# Patient Record
Sex: Female | Born: 1995 | Race: Black or African American | Hispanic: No | Marital: Single | Smoking: Former smoker
Health system: Southern US, Community
[De-identification: ages and names within clinical notes are randomized; demographics above are authoritative.]

## PROBLEM LIST (undated history)

## (undated) DIAGNOSIS — R51 Headache: Secondary | ICD-10-CM

## (undated) DIAGNOSIS — Z789 Other specified health status: Secondary | ICD-10-CM

## (undated) DIAGNOSIS — Z8619 Personal history of other infectious and parasitic diseases: Secondary | ICD-10-CM

## (undated) DIAGNOSIS — J302 Other seasonal allergic rhinitis: Secondary | ICD-10-CM

## (undated) DIAGNOSIS — R87810 Cervical high risk human papillomavirus (HPV) DNA test positive: Secondary | ICD-10-CM

## (undated) DIAGNOSIS — Z3490 Encounter for supervision of normal pregnancy, unspecified, unspecified trimester: Secondary | ICD-10-CM

## (undated) DIAGNOSIS — O139 Gestational [pregnancy-induced] hypertension without significant proteinuria, unspecified trimester: Secondary | ICD-10-CM

## (undated) DIAGNOSIS — R8789 Other abnormal findings in specimens from female genital organs: Secondary | ICD-10-CM

## (undated) DIAGNOSIS — R87610 Atypical squamous cells of undetermined significance on cytologic smear of cervix (ASC-US): Secondary | ICD-10-CM

## (undated) DIAGNOSIS — Z30017 Encounter for initial prescription of implantable subdermal contraceptive: Secondary | ICD-10-CM

## (undated) DIAGNOSIS — Z3009 Encounter for other general counseling and advice on contraception: Secondary | ICD-10-CM

## (undated) HISTORY — DX: Encounter for other general counseling and advice on contraception: Z30.09

## (undated) HISTORY — DX: Encounter for initial prescription of implantable subdermal contraceptive: Z30.017

## (undated) HISTORY — DX: Atypical squamous cells of undetermined significance on cytologic smear of cervix (ASC-US): R87.610

## (undated) HISTORY — DX: Headache: R51

## (undated) HISTORY — DX: Other seasonal allergic rhinitis: J30.2

## (undated) HISTORY — PX: NO PAST SURGERIES: SHX2092

## (undated) HISTORY — DX: Other abnormal findings in specimens from female genital organs: R87.89

## (undated) HISTORY — DX: Cervical high risk human papillomavirus (HPV) DNA test positive: R87.810

## (undated) HISTORY — DX: Personal history of other infectious and parasitic diseases: Z86.19

---
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
## 2015-08-20 ENCOUNTER — Telehealth: Payer: Self-pay | Admitting: Adult Health

## 2015-08-20 NOTE — Telephone Encounter (Signed)
Pt states that she is having heavy bleeding and lower left abdominal pain. Pt states that she has the nexplanon and has never had bleeding before this. Pt states that she has never had the pain on her lower left side either.   Pt was offered an appointment and she stated that she was going to the doctor tomorrow and that she would discuss the problems with her doctor tomorrow.

## 2015-09-04 ENCOUNTER — Ambulatory Visit (INDEPENDENT_AMBULATORY_CARE_PROVIDER_SITE_OTHER): Payer: Medicaid Other | Admitting: Obstetrics & Gynecology

## 2015-09-04 ENCOUNTER — Encounter: Payer: Self-pay | Admitting: Obstetrics & Gynecology

## 2015-09-04 VITALS — BP 100/60 | HR 84 | Ht 64.0 in | Wt 136.0 lb

## 2015-09-04 DIAGNOSIS — N938 Other specified abnormal uterine and vaginal bleeding: Secondary | ICD-10-CM

## 2015-09-04 DIAGNOSIS — N6320 Unspecified lump in the left breast, unspecified quadrant: Secondary | ICD-10-CM

## 2015-09-04 DIAGNOSIS — N63 Unspecified lump in breast: Secondary | ICD-10-CM

## 2015-09-04 MED ORDER — MEGESTROL ACETATE 40 MG PO TABS: ORAL_TABLET | ORAL | Status: DC

## 2015-09-04 NOTE — Progress Notes (Signed)
Patient ID: Stephanie Hodges, female   DOB: 01/12/1996, 19 y.o.   MRN: [id number] Chief Complaint  Patient presents with  . noticed lump in left breast    discuss going from Nexplanon to pill    Blood pressure 100/60, pulse 84, height 5\' 4"  (1.626 m), weight 136 lb (61.689 kg), last menstrual period 08/22/2015.  19 y.o. G1P0101 Patient's last menstrual period was 08/22/2015. The current method of family planning is nexplanon.  Subjective Painless left breast mass x 1 week Not associated with bleeding  Objective Firm irregular non tender 2x2 cm mass left breast Axilla negative  Pertinent ROS No sob or chest wall pain  Labs or studies     Impression Diagnoses this Encounter::   ICD-9-CM ICD-10-CM   1. Left breast mass 611.72 N63 MM Digital Diagnostic Unilat L     US BREAST COMPLETE UNI LEFT INC AXILLA     US BREAST COMPLETE UNI LEFT INC AXILLA     CANCELED: MM Digital Diagnostic Unilat L  2. DUB (dysfunctional uterine bleeding) 626.8 N93.8 megestrol (MEGACE) 40 MG tablet    Established relevant diagnosis(es):   Plan/Recommendations: Meds ordered this encounter  Medications  . etonogestrel (NEXPLANON) 68 MG IMPL implant    Sig: 1 each by Subdermal route once.  . megestrol (MEGACE) 40 MG tablet    Sig: 3x5d, 2x5d, then 1 daily to help control vaginal bleeding. Stop taking when bleeding stops.    Dispense:  45 tablet    Refill:  3    Labs or Scans Ordered: Orders Placed This Encounter  Procedures  . US BREAST COMPLETE UNI LEFT INC AXILLA      Follow up Return if symptoms worsen or fail to improve.     All questions were answered.

## 2015-09-08 ENCOUNTER — Ambulatory Visit: Payer: Medicaid Other | Admitting: Adult Health

## 2015-09-09 ENCOUNTER — Ambulatory Visit (HOSPITAL_COMMUNITY)
Admission: RE | Admit: 2015-09-09 | Discharge: 2015-09-09 | Disposition: A | Discharge status: Home / Self Care | Payer: Medicaid Other | Source: Ambulatory Visit | Attending: Obstetrics & Gynecology | Admitting: Obstetrics & Gynecology

## 2015-09-09 DIAGNOSIS — N6320 Unspecified lump in the left breast, unspecified quadrant: Secondary | ICD-10-CM

## 2015-09-09 DIAGNOSIS — N63 Unspecified lump in breast: Secondary | ICD-10-CM | POA: Insufficient documentation

## 2015-10-01 ENCOUNTER — Ambulatory Visit: Payer: Medicaid Other | Admitting: Obstetrics and Gynecology

## 2015-11-17 ENCOUNTER — Encounter: Payer: Medicaid Other | Admitting: Women's Health

## 2016-01-27 ENCOUNTER — Telehealth: Payer: Self-pay | Admitting: *Deleted

## 2016-01-27 NOTE — Telephone Encounter (Signed)
Pt informed Megace refills are at [place] from the original RX on 08/2015.

## 2016-02-02 ENCOUNTER — Telehealth: Payer: Self-pay | Admitting: Obstetrics & Gynecology

## 2016-02-02 DIAGNOSIS — D242 Benign neoplasm of left breast: Secondary | ICD-10-CM

## 2016-02-03 NOTE — Telephone Encounter (Signed)
Order placed for left breast US

## 2016-02-09 ENCOUNTER — Telehealth: Payer: Self-pay | Admitting: *Deleted

## 2016-02-09 NOTE — Telephone Encounter (Signed)
Pt informed ultrasound [facility] appt scheduled for 02/17/2016 @ 0845, no powder, lotion, or deodorant. Pt verbalized understanding.

## 2016-02-17 ENCOUNTER — Ambulatory Visit (HOSPITAL_COMMUNITY)
Admission: RE | Admit: 2016-02-17 | Discharge: 2016-02-17 | Disposition: A | Discharge status: Home / Self Care | Payer: Medicaid Other | Source: Ambulatory Visit | Attending: Adult Health | Admitting: Adult Health

## 2016-02-17 DIAGNOSIS — D242 Benign neoplasm of left breast: Secondary | ICD-10-CM | POA: Diagnosis present

## 2016-06-29 ENCOUNTER — Ambulatory Visit: Payer: Medicaid Other | Admitting: Obstetrics & Gynecology

## 2016-06-29 ENCOUNTER — Encounter: Payer: Self-pay | Admitting: Obstetrics & Gynecology

## 2016-08-09 ENCOUNTER — Telehealth: Payer: Self-pay | Admitting: Obstetrics & Gynecology

## 2016-08-09 NOTE — Telephone Encounter (Signed)
Pt called stating that she needs a refill of her medication megestrol (MEGACE) 40 MG tablet , Please contact pt

## 2016-08-09 NOTE — Telephone Encounter (Signed)
Requesting refill on Megace

## 2016-08-10 ENCOUNTER — Other Ambulatory Visit: Payer: Self-pay | Admitting: Obstetrics & Gynecology

## 2016-08-10 ENCOUNTER — Ambulatory Visit: Payer: Medicaid Other | Admitting: Obstetrics & Gynecology

## 2016-08-10 DIAGNOSIS — N938 Other specified abnormal uterine and vaginal bleeding: Secondary | ICD-10-CM

## 2016-08-10 MED ORDER — MEGESTROL ACETATE 40 MG PO TABS: ORAL_TABLET | ORAL | 11 refills | Status: DC

## 2016-08-13 ENCOUNTER — Ambulatory Visit: Payer: Medicaid Other | Admitting: Obstetrics & Gynecology

## 2016-08-25 ENCOUNTER — Other Ambulatory Visit: Payer: Self-pay | Admitting: Advanced Practice Midwife

## 2016-08-25 DIAGNOSIS — N938 Other specified abnormal uterine and vaginal bleeding: Secondary | ICD-10-CM

## 2016-08-25 MED ORDER — MEGESTROL ACETATE 40 MG PO TABS: ORAL_TABLET | ORAL | 11 refills | Status: DC

## 2016-08-25 NOTE — Progress Notes (Signed)
Refill megatce

## 2016-09-13 ENCOUNTER — Telehealth: Payer: Self-pay | Admitting: *Deleted

## 2016-09-13 DIAGNOSIS — N6320 Unspecified lump in the left breast, unspecified quadrant: Secondary | ICD-10-CM

## 2016-09-13 NOTE — Telephone Encounter (Signed)
Left message x 1. JSY 

## 2016-09-13 NOTE — Telephone Encounter (Signed)
Pt aware has left breast US 10/24 at 4 pm at [facility]

## 2016-09-13 NOTE — Telephone Encounter (Signed)
Spoke with pt. Pt needs breast US scheduled at [facility]. Mondays are best for pt, anytime. Thanks!! [place]

## 2016-09-21 ENCOUNTER — Other Ambulatory Visit: Payer: Self-pay | Admitting: Adult Health

## 2016-09-21 ENCOUNTER — Ambulatory Visit (HOSPITAL_COMMUNITY)
Admission: RE | Admit: 2016-09-21 | Discharge: 2016-09-21 | Disposition: A | Discharge status: Home / Self Care | Payer: Medicaid Other | Source: Ambulatory Visit | Attending: Adult Health | Admitting: Adult Health

## 2016-09-21 DIAGNOSIS — N6320 Unspecified lump in the left breast, unspecified quadrant: Secondary | ICD-10-CM | POA: Diagnosis present

## 2016-09-21 DIAGNOSIS — N6323 Unspecified lump in the left breast, lower outer quadrant: Secondary | ICD-10-CM | POA: Insufficient documentation

## 2016-09-28 ENCOUNTER — Ambulatory Visit (HOSPITAL_COMMUNITY)
Admission: RE | Admit: 2016-09-28 | Discharge: 2016-09-28 | Disposition: A | Discharge status: Home / Self Care | Payer: Medicaid Other | Source: Ambulatory Visit | Attending: Adult Health | Admitting: Adult Health

## 2016-09-28 ENCOUNTER — Other Ambulatory Visit: Payer: Self-pay | Admitting: Adult Health

## 2016-09-28 ENCOUNTER — Encounter (HOSPITAL_COMMUNITY): Payer: Self-pay

## 2016-09-28 DIAGNOSIS — D242 Benign neoplasm of left breast: Secondary | ICD-10-CM | POA: Diagnosis not present

## 2016-09-28 DIAGNOSIS — N6320 Unspecified lump in the left breast, unspecified quadrant: Secondary | ICD-10-CM | POA: Diagnosis present

## 2016-09-28 MED ORDER — LIDOCAINE HCL (PF) 1 % IJ SOLN
INTRAMUSCULAR | Status: AC
  Filled 2016-09-28: qty 10

## 2017-10-04 ENCOUNTER — Other Ambulatory Visit: Payer: Medicaid Other | Admitting: Adult Health

## 2017-10-19 ENCOUNTER — Encounter (HOSPITAL_COMMUNITY): Payer: Self-pay | Admitting: Emergency Medicine

## 2017-10-19 ENCOUNTER — Emergency Department (HOSPITAL_COMMUNITY)
Admission: EM | Admit: 2017-10-19 | Discharge: 2017-10-19 | Disposition: A | Discharge status: Home / Self Care | Payer: Medicaid Other | Attending: Emergency Medicine | Admitting: Emergency Medicine

## 2017-10-19 ENCOUNTER — Emergency Department (HOSPITAL_COMMUNITY): Payer: Medicaid Other

## 2017-10-19 DIAGNOSIS — S60941A Unspecified superficial injury of left index finger, initial encounter: Secondary | ICD-10-CM | POA: Diagnosis present

## 2017-10-19 DIAGNOSIS — Z79899 Other long term (current) drug therapy: Secondary | ICD-10-CM | POA: Diagnosis not present

## 2017-10-19 DIAGNOSIS — Y999 Unspecified external cause status: Secondary | ICD-10-CM | POA: Diagnosis not present

## 2017-10-19 DIAGNOSIS — Y929 Unspecified place or not applicable: Secondary | ICD-10-CM | POA: Insufficient documentation

## 2017-10-19 DIAGNOSIS — Y939 Activity, unspecified: Secondary | ICD-10-CM | POA: Diagnosis not present

## 2017-10-19 DIAGNOSIS — F17210 Nicotine dependence, cigarettes, uncomplicated: Secondary | ICD-10-CM | POA: Insufficient documentation

## 2017-10-19 DIAGNOSIS — Z0471 Encounter for examination and observation following alleged adult physical abuse: Secondary | ICD-10-CM | POA: Insufficient documentation

## 2017-10-19 DIAGNOSIS — S63611A Unspecified sprain of left index finger, initial encounter: Secondary | ICD-10-CM

## 2017-10-19 DIAGNOSIS — S21152A Open bite of left front wall of thorax without penetration into thoracic cavity, initial encounter: Secondary | ICD-10-CM | POA: Diagnosis not present

## 2017-10-19 DIAGNOSIS — T148XXA Other injury of unspecified body region, initial encounter: Secondary | ICD-10-CM

## 2017-10-19 MED ORDER — IBUPROFEN 600 MG PO TABS: 600.0000 mg | ORAL_TABLET | Freq: Three times a day (TID) | ORAL | 0 refills | Status: DC | PRN

## 2017-10-19 MED ORDER — AMOXICILLIN-POT CLAVULANATE 875-125 MG PO TABS: 1.0000 | ORAL_TABLET | Freq: Two times a day (BID) | ORAL | 0 refills | Status: DC

## 2017-10-19 MED ORDER — IBUPROFEN 800 MG PO TABS
800.0000 mg | ORAL_TABLET | Freq: Once | ORAL | Status: AC
  Administered 2017-10-19: 800 mg via ORAL
  Filled 2017-10-19: qty 1
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
## 2017-10-19 NOTE — Discharge Instructions (Signed)
Your x-rays of your left index finger are negative for bony injury or dislocation.  Wear the finger splint for comfort until your symptoms are improved.  Complete the entire course of the antibiotic prescribed.  Clean your bite wound with mild soapy water twice daily and watch closely for any signs of infection.
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
## 2017-10-24 ENCOUNTER — Other Ambulatory Visit (HOSPITAL_COMMUNITY)
Admission: RE | Admit: 2017-10-24 | Discharge: 2017-10-24 | Disposition: A | Discharge status: Home / Self Care | Payer: Medicaid Other | Source: Ambulatory Visit | Attending: Adult Health | Admitting: Adult Health

## 2017-10-24 ENCOUNTER — Encounter: Payer: Self-pay | Admitting: Adult Health

## 2017-10-24 ENCOUNTER — Ambulatory Visit: Payer: Medicaid Other | Admitting: Adult Health

## 2017-10-24 VITALS — BP 120/80 | HR 60 | Ht 65.0 in | Wt 161.0 lb

## 2017-10-24 DIAGNOSIS — N939 Abnormal uterine and vaginal bleeding, unspecified: Secondary | ICD-10-CM | POA: Insufficient documentation

## 2017-10-24 DIAGNOSIS — Z01411 Encounter for gynecological examination (general) (routine) with abnormal findings: Secondary | ICD-10-CM | POA: Diagnosis not present

## 2017-10-24 DIAGNOSIS — Z975 Presence of (intrauterine) contraceptive device: Secondary | ICD-10-CM | POA: Insufficient documentation

## 2017-10-24 DIAGNOSIS — Z01419 Encounter for gynecological examination (general) (routine) without abnormal findings: Secondary | ICD-10-CM

## 2017-10-24 DIAGNOSIS — Z3202 Encounter for pregnancy test, result negative: Secondary | ICD-10-CM | POA: Diagnosis not present

## 2017-10-24 DIAGNOSIS — F32A Depression, unspecified: Secondary | ICD-10-CM

## 2017-10-24 DIAGNOSIS — D242 Benign neoplasm of left breast: Secondary | ICD-10-CM | POA: Diagnosis not present

## 2017-10-24 DIAGNOSIS — F329 Major depressive disorder, single episode, unspecified: Secondary | ICD-10-CM

## 2017-10-24 DIAGNOSIS — Z0001 Encounter for general adult medical examination with abnormal findings: Secondary | ICD-10-CM

## 2017-10-24 DIAGNOSIS — R87610 Atypical squamous cells of undetermined significance on cytologic smear of cervix (ASC-US): Secondary | ICD-10-CM | POA: Diagnosis not present

## 2017-10-24 DIAGNOSIS — Z30011 Encounter for initial prescription of contraceptive pills: Secondary | ICD-10-CM | POA: Diagnosis not present

## 2017-10-24 LAB — POCT URINE PREGNANCY: PREG TEST UR: NEGATIVE

## 2017-10-24 MED ORDER — ESCITALOPRAM OXALATE 10 MG PO TABS: 10.0000 mg | ORAL_TABLET | Freq: Every day | ORAL | 6 refills | Status: DC

## 2017-10-24 MED ORDER — NORETHIN ACE-ETH ESTRAD-FE 1-20 MG-MCG(24) PO CAPS: 1.0000 | ORAL_CAPSULE | Freq: Every day | ORAL | 3 refills | Status: DC
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
## 2017-10-28 LAB — CYTOLOGY - PAP
CHLAMYDIA, DNA PROBE: NEGATIVE
Diagnosis: UNDETERMINED — AB
HPV: DETECTED — AB
NEISSERIA GONORRHEA: NEGATIVE

## 2017-10-31 ENCOUNTER — Ambulatory Visit: Payer: Medicaid Other | Admitting: Adult Health

## 2017-10-31 ENCOUNTER — Encounter: Payer: Self-pay | Admitting: Adult Health

## 2017-10-31 VITALS — BP 120/90 | HR 97 | Ht 65.0 in | Wt 154.0 lb

## 2017-10-31 DIAGNOSIS — Z3049 Encounter for surveillance of other contraceptives: Secondary | ICD-10-CM

## 2017-10-31 DIAGNOSIS — R87610 Atypical squamous cells of undetermined significance on cytologic smear of cervix (ASC-US): Secondary | ICD-10-CM

## 2017-10-31 DIAGNOSIS — R87810 Cervical high risk human papillomavirus (HPV) DNA test positive: Secondary | ICD-10-CM

## 2017-10-31 DIAGNOSIS — Z3046 Encounter for surveillance of implantable subdermal contraceptive: Secondary | ICD-10-CM

## 2017-10-31 HISTORY — DX: Atypical squamous cells of undetermined significance on cytologic smear of cervix (ASC-US): R87.610

## 2017-10-31 NOTE — Progress Notes (Signed)
Subjective:     Patient ID: Stephanie Hodges, female   DOB: August 24, 1996, 21 y.o.   MRN: [id number]  HPI Stephanie Hodges is a 21 year old black female in for nexplanon removal, is on [place] already.   Review of Systems For nexplanon removal Reviewed past medical,surgical, social and family history. Reviewed medications and allergies.     Objective:   Physical Exam BP 120/90 (BP Location: Right Arm, Patient Position: Sitting, Cuff Size: Small)   Pulse 97   Ht 5\' 5"  (1.651 m)   Wt 154 lb (69.9 kg)   LMP 10/24/2017   BMI 25.63 kg/m Consent sigend. Left arm cleansed with betadine, and injected with 1.5 cc 2% lidocaine and waited til numb.Under sterile technique a #11 blade was used to make small vertical incision, and a curved forceps was used to easily remove rod. Steri strips applied. Pressure dressing applied.   Discussed that pap was ASCUS with +HPV and negative for GC/chlamydia, will get colpo next week.  Assessment:     1. Encounter for Nexplanon removal   2. ASCUS with positive high risk HPV cervical       Plan:     Use condoms, keep clean and dry x 24 hours, no heavy lifting, keep steri strips on x 72 hours, Keep pressure dressing on x 24 hours. Not given for work, no heavy lifting for 24 hours.   Schedule colpo in 1 week with Dr Elonda Husky F/u with me in 6 weeks on lexapro Review handouts on abnormal pap and HPV

## 2017-10-31 NOTE — Patient Instructions (Signed)
Human Papillomavirus Human papillomavirus (HPV) is the most common sexually transmitted infection (STI). It is easy to pass it from person to person (contagious). HPV can cause cervical cancer, anal cancer, and genital warts. The genital warts can be seen and felt. Also, there may be wartlike regions in the throat. HPV may not have any symptoms. It is possible to have HPV for a long time and not know it. You may pass HPV on to others without knowing it. Follow these instructions at home:  Take medicines as told by your doctor.  Use over-the-counter creams for itching as told by your doctor.  Keep all follow-up visits. Make sure to get Pap tests as told by your doctor.  Do not touch or scratch the warts.  Do not treat genital warts with medicines used for treating hand warts.  Do not have sex while you are getting treatment.  Do not douche or use tampons during treatment of HPV.  Tell your sex partner about your infection because he or she may also need treatment.  If you get pregnant, tell your doctor that you had HPV. Your doctor will watch your pregnancy closely. This is important to keep your baby safe.  After treatment, use condoms during sex to prevent future infections.  Have only one sex partner.  Have a sex partner who does not have other sex partners. Contact a doctor if:  The treated skin is red, swollen, or painful.  You have a fever.  You feel ill.  You feel lumps or pimple-like areas in and around your genital area.  You have bleeding of the vagina or the area that was treated.  You have pain during sex. This information is not intended to replace advice given to you by your health care provider. Make sure you discuss any questions you have with your health care provider. Document Released: 10/28/2008 Document Revised: 04/22/2016 Document Reviewed: 02/20/2014 Elsevier Interactive Patient Education  2017 [employer]. Use condoms,, keep clean and dry x 24  hours, no heavy lifting, keep steri strips on x 72 hours, Keep pressure dressing on x 24 hours. Follow up in 1 week for colpo

## 2017-11-08 ENCOUNTER — Encounter: Payer: Medicaid Other | Admitting: Obstetrics & Gynecology

## 2017-11-09 ENCOUNTER — Telehealth: Payer: Self-pay | Admitting: *Deleted

## 2017-11-09 NOTE — Telephone Encounter (Signed)
Pt is requesting refill on megesterol. Informed pt that I would send her request to a provider and she should check with her pharmacy in the next 24 hrs.

## 2017-11-10 NOTE — Telephone Encounter (Signed)
Pt had nexplanon removed 12/3, was on COC for 2 week, never took them right, stopped completely 2 days ago. Thinks shes pregnant.  If test is neg, she will call back and we can figure out a [facility]

## 2017-11-28 ENCOUNTER — Other Ambulatory Visit: Payer: Self-pay

## 2017-11-28 ENCOUNTER — Encounter (HOSPITAL_COMMUNITY): Payer: Self-pay | Admitting: Emergency Medicine

## 2017-11-28 ENCOUNTER — Emergency Department (HOSPITAL_COMMUNITY)
Admission: EM | Admit: 2017-11-28 | Discharge: 2017-11-28 | Disposition: A | Discharge status: Home / Self Care | Payer: Medicaid Other | Attending: Emergency Medicine | Admitting: Emergency Medicine

## 2017-11-28 ENCOUNTER — Encounter: Payer: Medicaid Other | Admitting: Obstetrics & Gynecology

## 2017-11-28 DIAGNOSIS — L509 Urticaria, unspecified: Secondary | ICD-10-CM | POA: Diagnosis not present

## 2017-11-28 DIAGNOSIS — F17210 Nicotine dependence, cigarettes, uncomplicated: Secondary | ICD-10-CM | POA: Insufficient documentation

## 2017-11-28 DIAGNOSIS — R21 Rash and other nonspecific skin eruption: Secondary | ICD-10-CM | POA: Diagnosis present

## 2017-11-28 DIAGNOSIS — Z79899 Other long term (current) drug therapy: Secondary | ICD-10-CM | POA: Diagnosis not present

## 2017-11-28 DIAGNOSIS — F329 Major depressive disorder, single episode, unspecified: Secondary | ICD-10-CM | POA: Diagnosis not present

## 2017-11-28 LAB — POC URINE PREG, ED: PREG TEST UR: NEGATIVE

## 2017-11-28 MED ORDER — DIPHENHYDRAMINE HCL 25 MG PO CAPS
25.0000 mg | ORAL_CAPSULE | Freq: Once | ORAL | Status: AC
  Administered 2017-11-28: 25 mg via ORAL
  Filled 2017-11-28: qty 1

## 2017-11-28 MED ORDER — PREDNISONE 10 MG PO TABS: ORAL_TABLET | ORAL | 0 refills | Status: DC

## 2017-11-28 NOTE — Discharge Instructions (Signed)
Continue taking over-the-counter Benadryl 1 capsule every 4-6 hours as needed for itching.  Cool compresses on and off.  Follow-up with your primary doctor or return here for any worsening symptoms.

## 2017-11-28 NOTE — ED Triage Notes (Signed)
Rash to bilateral arms and neck since this morning.  Took benadryl this morning with no relief.

## 2017-11-28 NOTE — ED Provider Notes (Signed)
[facility] EMERGENCY DEPARTMENT Provider Note   CSN: [id number] Arrival date & time: 11/28/17  1502     History   Chief Complaint Chief Complaint  Patient presents with  . Rash    HPI Stephanie Hodges is a 21 y.o. female.  HPI  Stephanie Hodges is a 21 y.o. female who presents to the Emergency Department complaining of burning itching rash to both arms and neck.  States that she woke this morning and noticed several red, raised areas to both arms and the back of her neck.  She took Benadryl earlier today without relief.  She denies known insect bites, new medications, or exposure to new products.  No chest tightness, wheezing, shortness of breath, difficulty swallowing, or swelling of the lips or tongue.  No prior history of anaphylaxis   Past Medical History:  Diagnosis Date  . 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 Contraceptive education 10/03/2014  . Headache(784.0)    migraines  . History of chlamydia   . Nexplanon insertion 10/16/2014   Inserted left arm 10/16/14 remove 10/16/17  . No pertinent past medical history   . Pregnant   . Seasonal allergies     Patient Active Problem List   Diagnosis Date Noted  . 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 Encounter for Nexplanon removal 10/31/2017  . Nexplanon in place 10/24/2017  . 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 Abnormal uterine bleeding (AUB) 10/24/2017  . Encounter for initial prescription of contraceptive pills 10/24/2017  . Depression 10/24/2017  . Pregnancy examination or test, negative result 10/24/2017  . Well woman exam with routine gynecological exam 10/16/2014  . Contraceptive education 10/03/2014    Past Surgical History:  Procedure Laterality Date  . NO PAST SURGERIES      OB History    Gravida Para Term Preterm AB Living   1 1   1   1    SAB TAB Ectopic Multiple Live Births           1       Home Medications    Prior to Admission medications   Medication  Sig Start Date End Date Taking? Authorizing Provider  Acetaminophen (TYLENOL PO) Take by mouth as needed.    [provider]  aspirin 81 MG tablet Take 81 mg by mouth as needed for pain.    [provider]  escitalopram (LEXAPRO) 10 MG tablet Take 1 tablet (10 mg total) by mouth daily. 10/24/17   Estill Dooms, NP  ibuprofen (ADVIL,MOTRIN) 600 MG tablet Take 1 tablet (600 mg total) by mouth every 8 (eight) hours as needed for moderate pain. 10/19/17   Evalee Jefferson, PA-C  Norethin Ace-Eth Estrad-FE (TAYTULLA) 1-20 MG-MCG(24) CAPS Take 1 tablet by mouth daily. 10/24/17   Estill Dooms, NP    Family History Family History  Problem Relation Age of Onset  . Hypertension Mother   . Stroke Mother     Social History Social History   Tobacco Use  . Smoking status: Current Every Day Smoker    Packs/day: 0.22    Types: Cigarettes  . Smokeless tobacco: Never Used  Substance Use Topics  . Alcohol use: No  . Drug use: No     Allergies   Coconut flavor [flavoring agent] and Mushroom extract complex   Review of Systems Review of Systems  Constitutional: Negative for activity change, appetite change, chills and fever.  HENT: Negative for facial swelling, sore throat and trouble swallowing.  Respiratory: Negative for chest tightness, shortness of breath and wheezing.   Gastrointestinal: Negative for nausea and vomiting.  Genitourinary: Negative for dysuria.  Musculoskeletal: Negative for myalgias, neck pain and neck stiffness.  Skin: Positive for rash. Negative for wound.  Neurological: Negative for dizziness, weakness, numbness and headaches.  All other systems reviewed and are negative.    Physical Exam Updated Vital Signs BP 130/83 (BP Location: Right Arm)   Pulse (!) 105   Temp 98.7 F (37.1 C) (Oral)   Resp 18   Ht 5\' 5"  (1.651 m)   Wt 72.6 kg (160 lb)   SpO2 100%   BMI 26.63 kg/m   Physical Exam  Constitutional: She is oriented to person,  place, and time. She appears well-developed and well-nourished. No distress.  HENT:  Head: Normocephalic and atraumatic.  Mouth/Throat: Uvula is midline, oropharynx is clear and moist and mucous membranes are normal. No oral lesions. No uvula swelling.  Airway patent, no edema of the lips or tongue  Eyes: EOM are normal. Pupils are equal, round, and reactive to light.  Neck: Normal range of motion. Neck supple.  Cardiovascular: Normal rate, regular rhythm and intact distal pulses.  No murmur heard. Pulmonary/Chest: Effort normal and breath sounds normal. No respiratory distress.  Musculoskeletal: She exhibits no edema or tenderness.  Lymphadenopathy:    She has no cervical adenopathy.  Neurological: She is alert and oriented to person, place, and time. She exhibits normal muscle tone. Coordination normal.  Skin: Skin is warm. Capillary refill takes less than 2 seconds. Rash noted. There is erythema.  Several slightly raised erythematous plaques to the bilateral upper extremities and posterior neck.  No vesicles or pustules.  Psychiatric: She has a normal mood and affect.  Nursing note and vitals reviewed.    ED Treatments / Results  Labs (all labs ordered are listed, but only abnormal results are displayed) Labs Reviewed  POC URINE PREG, ED    EKG  EKG Interpretation None       Radiology No results found.  Procedures Procedures (including critical care time)  Medications Ordered in ED Medications  diphenhydrAMINE (BENADRYL) capsule 25 mg (not administered)     Initial Impression / Assessment and Plan / ED Course  I have reviewed the triage vital signs and the nursing notes.  Pertinent labs & imaging results that were available during my care of the patient were reviewed by me and considered in my medical decision making (see chart for details).     Patient well-appearing.  Airways patent.  Symptoms are localized to the upper extremities and posterior neck.  No  new exposures or medications.  Rash appears consistent with urticaria  Patient appears stable for discharge, she agrees to take over-the-counter Benadryl and prescription steroid.  Return precautions were discussed.  Final Clinical Impressions(s) / ED Diagnoses   Final diagnoses:  Urticaria    ED Discharge Orders    None       Kem Parkinson, PA-C 11/28/17 1649    Noemi Chapel, MD 12/05/17 1043

## 2017-12-10 ENCOUNTER — Encounter (HOSPITAL_COMMUNITY): Payer: Self-pay | Admitting: Emergency Medicine

## 2017-12-10 ENCOUNTER — Other Ambulatory Visit: Payer: Self-pay

## 2017-12-10 ENCOUNTER — Emergency Department (HOSPITAL_COMMUNITY)
Admission: EM | Admit: 2017-12-10 | Discharge: 2017-12-10 | Disposition: A | Discharge status: Home / Self Care | Payer: Medicaid Other | Attending: Emergency Medicine | Admitting: Emergency Medicine

## 2017-12-10 ENCOUNTER — Emergency Department (HOSPITAL_COMMUNITY): Payer: Medicaid Other

## 2017-12-10 DIAGNOSIS — Z7982 Long term (current) use of aspirin: Secondary | ICD-10-CM | POA: Insufficient documentation

## 2017-12-10 DIAGNOSIS — N3000 Acute cystitis without hematuria: Secondary | ICD-10-CM

## 2017-12-10 DIAGNOSIS — Z79899 Other long term (current) drug therapy: Secondary | ICD-10-CM | POA: Insufficient documentation

## 2017-12-10 DIAGNOSIS — F17210 Nicotine dependence, cigarettes, uncomplicated: Secondary | ICD-10-CM | POA: Diagnosis not present

## 2017-12-10 DIAGNOSIS — R112 Nausea with vomiting, unspecified: Secondary | ICD-10-CM | POA: Insufficient documentation

## 2017-12-10 DIAGNOSIS — N760 Acute vaginitis: Secondary | ICD-10-CM | POA: Diagnosis not present

## 2017-12-10 DIAGNOSIS — B9689 Other specified bacterial agents as the cause of diseases classified elsewhere: Secondary | ICD-10-CM | POA: Insufficient documentation

## 2017-12-10 DIAGNOSIS — R1032 Left lower quadrant pain: Secondary | ICD-10-CM | POA: Diagnosis present

## 2017-12-10 LAB — LIPASE, BLOOD: Lipase: 26 U/L (ref 11–51)

## 2017-12-10 LAB — URINALYSIS, ROUTINE W REFLEX MICROSCOPIC
Bilirubin Urine: NEGATIVE
Glucose, UA: NEGATIVE mg/dL
Ketones, ur: NEGATIVE mg/dL
Nitrite: NEGATIVE
Protein, ur: NEGATIVE mg/dL
Specific Gravity, Urine: 1.024 (ref 1.005–1.030)
pH: 6 (ref 5.0–8.0)

## 2017-12-10 LAB — COMPREHENSIVE METABOLIC PANEL
ALT: 18 U/L (ref 14–54)
AST: 21 U/L (ref 15–41)
Albumin: 3.7 g/dL (ref 3.5–5.0)
Alkaline Phosphatase: 74 U/L (ref 38–126)
Anion gap: 11 (ref 5–15)
BUN: 13 mg/dL (ref 6–20)
CO2: 23 mmol/L (ref 22–32)
Calcium: 9 mg/dL (ref 8.9–10.3)
Chloride: 102 mmol/L (ref 101–111)
Creatinine, Ser: 0.97 mg/dL (ref 0.44–1.00)
GFR calc Af Amer: >60 mL/min (ref >60)
GFR calc non Af Amer: >60 mL/min (ref >60)
Glucose, Bld: 93 mg/dL (ref 65–99)
Potassium: 3.9 mmol/L (ref 3.5–5.1)
Sodium: 136 mmol/L (ref 135–145)
Total Bilirubin: 0.9 mg/dL (ref 0.3–1.2)
Total Protein: 7.4 g/dL (ref 6.5–8.1)

## 2017-12-10 LAB — I-STAT BETA HCG BLOOD, ED (MC, WL, AP ONLY): I-stat hCG, quantitative: <5 m[IU]/mL (ref <5)

## 2017-12-10 LAB — CBC
HCT: 35.3 % — ABNORMAL LOW (ref 36.0–46.0)
Hemoglobin: 12.4 g/dL (ref 12.0–15.0)
MCH: 29.7 pg (ref 26.0–34.0)
MCHC: 35.1 g/dL (ref 30.0–36.0)
MCV: 84.4 fL (ref 78.0–100.0)
Platelets: 344 10*3/uL (ref 150–400)
RBC: 4.18 MIL/uL (ref 3.87–5.11)
RDW: 12.8 % (ref 11.5–15.5)
WBC: 6.8 10*3/uL (ref 4.0–10.5)

## 2017-12-10 LAB — WET PREP, GENITAL
Sperm: NONE SEEN
Trich, Wet Prep: NONE SEEN
Yeast Wet Prep HPF POC: NONE SEEN

## 2017-12-10 MED ORDER — METRONIDAZOLE 500 MG PO TABS: 500.0000 mg | ORAL_TABLET | Freq: Two times a day (BID) | ORAL | 0 refills | Status: DC

## 2017-12-10 MED ORDER — ONDANSETRON 4 MG PO TBDP
4.0000 mg | ORAL_TABLET | Freq: Once | ORAL | Status: AC
  Administered 2017-12-10: 4 mg via ORAL
  Filled 2017-12-10: qty 1

## 2017-12-10 MED ORDER — FOSFOMYCIN TROMETHAMINE 3 G PO PACK
3.0000 g | PACK | Freq: Once | ORAL | Status: AC
  Administered 2017-12-10: 3 g via ORAL
  Filled 2017-12-10: qty 3

## 2017-12-10 MED ORDER — SODIUM CHLORIDE 0.9 % IV BOLUS (SEPSIS)
1000.0000 mL | Freq: Once | INTRAVENOUS | Status: AC
  Administered 2017-12-10: 1000 mL via INTRAVENOUS

## 2017-12-10 MED ORDER — MORPHINE SULFATE (PF) 4 MG/ML IV SOLN
4.0000 mg | Freq: Once | INTRAVENOUS | Status: AC
  Administered 2017-12-10: 4 mg via INTRAVENOUS
  Filled 2017-12-10: qty 1

## 2017-12-10 MED ORDER — ONDANSETRON HCL 4 MG/2ML IJ SOLN
4.0000 mg | Freq: Once | INTRAMUSCULAR | Status: AC
  Administered 2017-12-10: 4 mg via INTRAVENOUS
  Filled 2017-12-10: qty 2

## 2017-12-10 MED ORDER — ONDANSETRON HCL 4 MG PO TABS: 4.0000 mg | ORAL_TABLET | Freq: Three times a day (TID) | ORAL | 0 refills | Status: DC | PRN

## 2017-12-10 MED ORDER — FAMOTIDINE 20 MG PO TABS
20.0000 mg | ORAL_TABLET | Freq: Once | ORAL | Status: AC
  Administered 2017-12-10: 20 mg via ORAL
  Filled 2017-12-10: qty 1

## 2017-12-10 NOTE — ED Provider Notes (Signed)
[facility] EMERGENCY DEPARTMENT Provider Note   CSN: [id number] Arrival date & time: 12/10/17  1108     History   Chief Complaint Chief Complaint  Patient presents with  . Abdominal Pain   HPI   Complaining of multiple episodes of nonbloody, nonbilious, non-coffee-ground emesis onset this morning followed by severe left lower quadrant abdominal.  Patient denies any fevers, chills, dysuria, hematuria, abnormal vaginal discharge.  Her last menstrual period was on January 2.  Patient denies fevers, chills, sick contacts, diarrhea. 8/10 pain.   Past Medical History:  Diagnosis Date  . 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 Contraceptive education 10/03/2014  . Headache(784.0)    migraines  . History of chlamydia   . Nexplanon insertion 10/16/2014   Inserted left arm 10/16/14 remove 10/16/17  . No pertinent past medical history   . Pregnant   . Seasonal allergies     Patient Active Problem List   Diagnosis Date Noted  . 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 Encounter for Nexplanon removal 10/31/2017  . Nexplanon in place 10/24/2017  . 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 Abnormal uterine bleeding (AUB) 10/24/2017  . Encounter for initial prescription of contraceptive pills 10/24/2017  . Depression 10/24/2017  . Pregnancy examination or test, negative result 10/24/2017  . Well woman exam with routine gynecological exam 10/16/2014  . Contraceptive education 10/03/2014    Past Surgical History:  Procedure Laterality Date  . NO PAST SURGERIES      OB History    Gravida Para Term Preterm AB Living   1 1   1   1    SAB TAB Ectopic Multiple Live Births           1       Home Medications    Prior to Admission medications   Medication Sig Start Date End Date Taking? Authorizing Provider  Acetaminophen (TYLENOL PO) Take by mouth as needed.   Yes [provider]  aspirin 81 MG tablet Take 81 mg by mouth as  needed for pain.   Yes [provider]  cetirizine (ZYRTEC) 10 MG tablet Take 10 mg by mouth daily.   Yes [provider]  escitalopram (LEXAPRO) 10 MG tablet Take 1 tablet (10 mg total) by mouth daily. 10/24/17  Yes Estill Dooms, NP  ibuprofen (ADVIL,MOTRIN) 600 MG tablet Take 1 tablet (600 mg total) by mouth every 8 (eight) hours as needed for moderate pain. 10/19/17  Yes Idol, Almyra Free, PA-C  Norethin Ace-Eth Estrad-FE (TAYTULLA) 1-20 MG-MCG(24) CAPS Take 1 tablet by mouth daily. Patient not taking: Reported on 12/10/2017 10/24/17   Estill Dooms, NP    Family History Family History  Problem Relation Age of Onset  . Hypertension Mother   . Stroke Mother     Social History Social History   Tobacco Use  . Smoking status: Current Every Day Smoker    Packs/day: 0.22    Types: Cigarettes  . Smokeless tobacco: Never Used  Substance Use Topics  . Alcohol use: No  . Drug use: No     Allergies   Coconut flavor [flavoring agent] and Mushroom extract complex   Review of Systems Review of Systems  A complete review of systems was obtained and all systems are negative except as noted in the HPI and PMH.   Physical Exam Updated Vital Signs BP 122/77   Pulse 84   Temp 98.2 F (36.8 C) (Oral)  Resp 16   LMP 11/29/2017   SpO2 97%   Physical Exam  Constitutional: She is oriented to person, place, and time. She appears well-developed and well-nourished. No distress.  HENT:  Head: Normocephalic.  Mouth/Throat: Oropharynx is clear and moist.  Eyes: Conjunctivae are normal.  Neck: Normal range of motion. No JVD present. No tracheal deviation present.  Cardiovascular: Normal rate, regular rhythm and intact distal pulses.  Radial pulse equal bilaterally  Pulmonary/Chest: Effort normal and breath sounds normal. No stridor. No respiratory distress. She has no wheezes. She has no rales. She exhibits no tenderness.  Abdominal: Soft. She exhibits no  distension and no mass. There is no tenderness. There is no rebound and no guarding.  Genitourinary:  Genitourinary Comments: Pelvic exam chaperoned by PA Harris, no abnormal vaginal discharge, no cervical motion or adnexal tenderness.  Musculoskeletal: Normal range of motion. She exhibits no edema or tenderness.  No calf asymmetry, superficial collaterals, palpable cords, edema, Homans sign negative bilaterally.    Neurological: She is alert and oriented to person, place, and time.  Skin: Skin is warm. She is not diaphoretic.  Psychiatric: She has a normal mood and affect.  Nursing note and vitals reviewed.    ED Treatments / Results  Labs (all labs ordered are listed, but only abnormal results are displayed) Labs Reviewed  CBC - Abnormal; Notable for the following components:      Result Value   HCT 35.3 (*)    All other components within normal limits  URINALYSIS, ROUTINE W REFLEX MICROSCOPIC - Abnormal; Notable for the following components:   APPearance HAZY (*)    Hgb urine dipstick SMALL (*)    Leukocytes, UA LARGE (*)    Bacteria, UA FEW (*)    Squamous Epithelial / LPF 6-30 (*)    All other components within normal limits  WET PREP, GENITAL  URINE CULTURE  LIPASE, BLOOD  COMPREHENSIVE METABOLIC PANEL  RPR  HIV ANTIBODY (ROUTINE TESTING)  I-STAT BETA HCG BLOOD, ED ([facility], [facility], [facility] ONLY)  GC/CHLAMYDIA PROBE AMP ([facility]) NOT AT [facility]    EKG  EKG Interpretation None       Radiology US Transvaginal Non-ob  Result Date: 12/10/2017 CLINICAL DATA:  Left pelvic pain since this morning. EXAM: TRANSABDOMINAL AND TRANSVAGINAL ULTRASOUND OF PELVIS DOPPLER ULTRASOUND OF OVARIES TECHNIQUE: Both transabdominal and transvaginal ultrasound examinations of the pelvis were performed. Transabdominal technique was performed for global imaging of the pelvis including uterus, ovaries, adnexal regions, and pelvic cul-de-sac. It was necessary to proceed with endovaginal exam following  the transabdominal exam to visualize the bilateral ovaries. Color and duplex Doppler ultrasound was utilized to evaluate blood flow to the ovaries. COMPARISON:  None. FINDINGS: Uterus Measurements: 7.6 x 3.1 x 4.5 cm. The uterus is retroverted. No fibroids or other mass visualized. Endometrium Thickness: 2 mm.  No focal abnormality visualized. Right ovary Measurements: 3.1 x 1.6 x 2.1 cm. Normal appearance/no adnexal mass. Left ovary Measurements: 2.8 x 1.6 x 2 cm.  Normal appearance/no adnexal mass. Pulsed Doppler evaluation of both ovaries demonstrates normal low-resistance arterial and venous waveforms. Other findings Trace free fluid is identified in the pelvis. IMPRESSION: No acute abnormality identified. Electronically Signed   By: Abelardo Diesel M.D.   On: 12/10/2017 15:17   US Pelvis Complete  Result Date: 12/10/2017 CLINICAL DATA:  Left pelvic pain since this morning. EXAM: TRANSABDOMINAL AND TRANSVAGINAL ULTRASOUND OF PELVIS DOPPLER ULTRASOUND OF OVARIES TECHNIQUE: Both transabdominal and transvaginal ultrasound examinations of the pelvis  were performed. Transabdominal technique was performed for global imaging of the pelvis including uterus, ovaries, adnexal regions, and pelvic cul-de-sac. It was necessary to proceed with endovaginal exam following the transabdominal exam to visualize the bilateral ovaries. Color and duplex Doppler ultrasound was utilized to evaluate blood flow to the ovaries. COMPARISON:  None. FINDINGS: Uterus Measurements: 7.6 x 3.1 x 4.5 cm. The uterus is retroverted. No fibroids or other mass visualized. Endometrium Thickness: 2 mm.  No focal abnormality visualized. Right ovary Measurements: 3.1 x 1.6 x 2.1 cm. Normal appearance/no adnexal mass. Left ovary Measurements: 2.8 x 1.6 x 2 cm.  Normal appearance/no adnexal mass. Pulsed Doppler evaluation of both ovaries demonstrates normal low-resistance arterial and venous waveforms. Other findings Trace free fluid is identified in the  pelvis. IMPRESSION: No acute abnormality identified. Electronically Signed   By: Abelardo Diesel M.D.   On: 12/10/2017 15:17   [place] Art/ven Flow Abd Pelv Doppler  Result Date: 12/10/2017 CLINICAL DATA:  Left pelvic pain since this morning. EXAM: TRANSABDOMINAL AND TRANSVAGINAL ULTRASOUND OF PELVIS DOPPLER ULTRASOUND OF OVARIES TECHNIQUE: Both transabdominal and transvaginal ultrasound examinations of the pelvis were performed. Transabdominal technique was performed for global imaging of the pelvis including uterus, ovaries, adnexal regions, and pelvic cul-de-sac. It was necessary to proceed with endovaginal exam following the transabdominal exam to visualize the bilateral ovaries. Color and duplex Doppler ultrasound was utilized to evaluate blood flow to the ovaries. COMPARISON:  None. FINDINGS: Uterus Measurements: 7.6 x 3.1 x 4.5 cm. The uterus is retroverted. No fibroids or other mass visualized. Endometrium Thickness: 2 mm.  No focal abnormality visualized. Right ovary Measurements: 3.1 x 1.6 x 2.1 cm. Normal appearance/no adnexal mass. Left ovary Measurements: 2.8 x 1.6 x 2 cm.  Normal appearance/no adnexal mass. Pulsed Doppler evaluation of both ovaries demonstrates normal low-resistance arterial and venous waveforms. Other findings Trace free fluid is identified in the pelvis. IMPRESSION: No acute abnormality identified. Electronically Signed   By: Abelardo Diesel M.D.   On: 12/10/2017 15:17    Procedures Procedures (including critical care time)  Medications Ordered in ED Medications  ondansetron (ZOFRAN-ODT) disintegrating tablet 4 mg (4 mg Oral Given 12/10/17 1202)  famotidine (PEPCID) tablet 20 mg (20 mg Oral Given 12/10/17 1202)  sodium chloride 0.9 % bolus 1,000 mL (0 mLs Intravenous Stopped 12/10/17 1526)  ondansetron (ZOFRAN) injection 4 mg (4 mg Intravenous Given 12/10/17 1407)  morphine 4 MG/ML injection 4 mg (4 mg Intravenous Given 12/10/17 1408)     Initial Impression / Assessment and  Plan / ED Course  I have reviewed the triage vital signs and the nursing notes.  Pertinent labs & imaging results that were available during my care of the patient were reviewed by me and considered in my medical decision making (see chart for details).     Vitals:   12/10/17 1119 12/10/17 1415  BP: (!) 142/92 122/77  Pulse: 89 84  Resp: 18 16  Temp: 98.2 F (36.8 C)   TempSrc: Oral   SpO2: 100% 97%    Medications  ondansetron (ZOFRAN-ODT) disintegrating tablet 4 mg (4 mg Oral Given 12/10/17 1202)  famotidine (PEPCID) tablet 20 mg (20 mg Oral Given 12/10/17 1202)  sodium chloride 0.9 % bolus 1,000 mL (0 mLs Intravenous Stopped 12/10/17 1526)  ondansetron (ZOFRAN) injection 4 mg (4 mg Intravenous Given 12/10/17 1407)  morphine 4 MG/ML injection 4 mg (4 mg Intravenous Given 12/10/17 1408)    Stephanie Hodges is 22 y.o. female presenting with multiple episodes  of emesis followed by left lower quadrant pain.  Abdominal exam benign with mild tenderness in the left lower quadrant no guarding or rebound.  Urinalysis with leukocytes however she has no urinary symptoms.  Urine culture pending, pelvic exam with no significant discharge, no signs of PID.  Ultrasound of ovary to rule out torsion negative.  Will p.o. challenge and DPA Harris will follow-up wet prep.   Final Clinical Impressions(s) / ED Diagnoses   Final diagnoses:  None    ED Discharge Orders    None       Karen Kays Charna Elizabeth 12/10/17 1608    Virgel Manifold, MD 12/11/17 0710

## 2017-12-10 NOTE — ED Provider Notes (Signed)
3:54 PM BP 122/77   Pulse 84   Temp 98.2 F (36.8 C) (Oral)   Resp 16   LMP 11/29/2017   SpO2 97%   Patient taken in sign from [place]. Patient had N/V. Pelvic exam negative, Awaiting Wet prep/. Expect dc.   Patient with abdominal pain nausea vomiting.  I have ordered keep for suspected urinary tract infection with fosfomycin.  She also has apparent bacterial vaginosis will be discharged home with Flagyl.  She is appropriate for discharge at this time.    Margarita Mail, PA-C 12/10/17 2327    Davonna Belling, MD 12/11/17 [phone]

## 2017-12-10 NOTE — ED Notes (Signed)
Pt vomited after PO challenge 

## 2017-12-10 NOTE — ED Triage Notes (Signed)
Pt presents to ED for assessment of lower mid abdominal pain, nausea and vomiting (inumerable) and general malaise.

## 2017-12-10 NOTE — Discharge Instructions (Signed)
You appear to have a urinary tract infection have been treated here with a single dose of antibiotic medication.  I am also discharging you with a course of Flagyl to treat bacterial vaginosis.  This is an infection in the vagina caused by bacteria being out of balance and is not a sexually transmitted illness.  Please complete the course of antibiotic.  Do not drink any alcohol with this medication as it can cause her to become violently ill.  He will be discharged with Zofran which is a medication to treat nausea.  Please continue to drink plenty of fluids.  Return to the emergency department for any fevers, chills, intractable nausea and vomiting.

## 2017-12-10 NOTE — ED Notes (Signed)
Patient transported to Ultrasound 

## 2017-12-11 LAB — URINE CULTURE

## 2017-12-11 LAB — HIV ANTIBODY (ROUTINE TESTING W REFLEX): HIV SCREEN 4TH GENERATION: NONREACTIVE

## 2017-12-12 ENCOUNTER — Ambulatory Visit: Payer: Medicaid Other | Admitting: Adult Health

## 2017-12-12 ENCOUNTER — Encounter: Payer: Medicaid Other | Admitting: Obstetrics & Gynecology

## 2017-12-12 LAB — RPR: RPR Ser Ql: NONREACTIVE

## 2017-12-12 LAB — GC/CHLAMYDIA PROBE AMP (~~LOC~~) NOT AT ARMC
CHLAMYDIA, DNA PROBE: NEGATIVE
NEISSERIA GONORRHEA: NEGATIVE

## 2017-12-26 ENCOUNTER — Encounter: Payer: Medicaid Other | Admitting: Obstetrics & Gynecology

## 2018-01-11 ENCOUNTER — Encounter: Payer: Self-pay | Admitting: Adult Health

## 2018-01-16 ENCOUNTER — Encounter: Payer: Medicaid Other | Admitting: Obstetrics & Gynecology

## 2018-01-23 ENCOUNTER — Encounter: Payer: Self-pay | Admitting: Obstetrics & Gynecology

## 2018-01-23 ENCOUNTER — Ambulatory Visit: Payer: Medicaid Other | Admitting: Obstetrics & Gynecology

## 2018-01-23 ENCOUNTER — Ambulatory Visit (INDEPENDENT_AMBULATORY_CARE_PROVIDER_SITE_OTHER): Payer: Medicaid Other | Admitting: *Deleted

## 2018-01-23 VITALS — BP 120/90 | HR 77 | Ht 65.0 in | Wt 161.5 lb

## 2018-01-23 DIAGNOSIS — R87810 Cervical high risk human papillomavirus (HPV) DNA test positive: Secondary | ICD-10-CM

## 2018-01-23 DIAGNOSIS — R87610 Atypical squamous cells of undetermined significance on cytologic smear of cervix (ASC-US): Secondary | ICD-10-CM | POA: Diagnosis not present

## 2018-01-23 DIAGNOSIS — Z3042 Encounter for surveillance of injectable contraceptive: Secondary | ICD-10-CM | POA: Diagnosis not present

## 2018-01-23 DIAGNOSIS — Z3202 Encounter for pregnancy test, result negative: Secondary | ICD-10-CM | POA: Diagnosis not present

## 2018-01-23 LAB — POCT URINE PREGNANCY: Preg Test, Ur: NEGATIVE

## 2018-01-23 MED ORDER — KETOROLAC TROMETHAMINE 10 MG PO TABS: 10.0000 mg | ORAL_TABLET | Freq: Three times a day (TID) | ORAL | 0 refills | Status: DC | PRN

## 2018-01-23 MED ORDER — ZOLPIDEM TARTRATE 10 MG PO TABS: 10.0000 mg | ORAL_TABLET | Freq: Every evening | ORAL | 0 refills | Status: DC | PRN

## 2018-01-23 MED ORDER — MEDROXYPROGESTERONE ACETATE 150 MG/ML IM SUSP
150.0000 mg | Freq: Once | INTRAMUSCULAR | Status: AC
  Administered 2018-01-23: 150 mg via INTRAMUSCULAR

## 2018-01-23 MED ORDER — MEDROXYPROGESTERONE ACETATE 150 MG/ML IM SUSP: 150.0000 mg | INTRAMUSCULAR | 3 refills | Status: DC

## 2018-01-23 MED ORDER — ESCITALOPRAM OXALATE 20 MG PO TABS: 20.0000 mg | ORAL_TABLET | Freq: Every day | ORAL | 11 refills | Status: DC

## 2018-01-23 NOTE — Progress Notes (Signed)
Pt given DepoProvera 150mg  IM left deltoid without complications. Pt seen earlier today with neg UPT. Advised to follow up in 12 weeks for next injections.

## 2018-01-23 NOTE — Progress Notes (Signed)
Colposcopy Procedure Note:  Colposcopy Procedure Note  Indications: Pap smear 2 months ago showed: ASCUS with POSITIVE high risk HPV. The prior pap showed ASCUS with POSITIVE high risk HPV.  Prior cervical/vaginal disease: normal exam without visible pathology. Prior cervical treatment: no treatment.  Smoker:  No. New sexual partner:  No.  : time frame:  No.  History of abnormal Pap: no  Procedure Details  The risks and benefits of the procedure and Written informed consent obtained.  Speculum placed in vagina and excellent visualization of cervix achieved, cervix swabbed x 3 with acetic acid solution.  Findings: Cervix: no visible lesions, no mosaicism, no punctation and no abnormal vasculature; SCJ visualized 360 degrees without lesions and no biopsies taken. Vaginal inspection: normal without visible lesions. Vulvar colposcopy: vulvar colposcopy not performed.  Specimens: none  Complications: none.  Plan: Follow-up Pap smear 1 year  Medications refilled today as well  Meds ordered this encounter  Medications  . medroxyPROGESTERone (DEPO-PROVERA) 150 MG/ML injection    Sig: Inject 1 mL (150 mg total) into the muscle every 3 (three) months.    Dispense:  1 mL    Refill:  3  . ketorolac (TORADOL) 10 MG tablet    Sig: Take 1 tablet (10 mg total) by mouth every 8 (eight) hours as needed.    Dispense:  15 tablet    Refill:  0  . escitalopram (LEXAPRO) 20 MG tablet    Sig: Take 1 tablet (20 mg total) by mouth daily.    Dispense:  30 tablet    Refill:  11  . zolpidem (AMBIEN) 10 MG tablet    Sig: Take 1 tablet (10 mg total) by mouth at bedtime as needed for sleep.    Dispense:  30 tablet    Refill:  0

## 2018-04-17 ENCOUNTER — Ambulatory Visit: Payer: Medicaid Other

## 2018-04-18 ENCOUNTER — Ambulatory Visit (INDEPENDENT_AMBULATORY_CARE_PROVIDER_SITE_OTHER): Payer: Medicaid Other

## 2018-04-18 ENCOUNTER — Ambulatory Visit: Payer: Medicaid Other

## 2018-04-18 VITALS — Ht 65.0 in | Wt 160.0 lb

## 2018-04-18 DIAGNOSIS — Z3042 Encounter for surveillance of injectable contraceptive: Secondary | ICD-10-CM | POA: Diagnosis not present

## 2018-04-18 DIAGNOSIS — Z3202 Encounter for pregnancy test, result negative: Secondary | ICD-10-CM | POA: Diagnosis not present

## 2018-04-18 LAB — POCT URINE PREGNANCY: Preg Test, Ur: NEGATIVE

## 2018-04-18 MED ORDER — MEDROXYPROGESTERONE ACETATE 150 MG/ML IM SUSP
150.0000 mg | Freq: Once | INTRAMUSCULAR | Status: AC
  Administered 2018-04-18: 150 mg via INTRAMUSCULAR

## 2018-04-18 NOTE — Progress Notes (Signed)
Pt here for depo injection 150 mg IM given Rt Deltoid. Tolerated well. Return 12 week for next injection. Pad CMA

## 2018-07-11 ENCOUNTER — Ambulatory Visit: Payer: Medicaid Other

## 2018-07-12 ENCOUNTER — Encounter: Payer: Self-pay | Admitting: Obstetrics & Gynecology

## 2018-07-12 ENCOUNTER — Ambulatory Visit (INDEPENDENT_AMBULATORY_CARE_PROVIDER_SITE_OTHER): Payer: Medicaid Other

## 2018-07-12 VITALS — Ht 65.0 in | Wt 157.0 lb

## 2018-07-12 DIAGNOSIS — Z3202 Encounter for pregnancy test, result negative: Secondary | ICD-10-CM

## 2018-07-12 DIAGNOSIS — Z3042 Encounter for surveillance of injectable contraceptive: Secondary | ICD-10-CM | POA: Diagnosis not present

## 2018-07-12 LAB — POCT URINE PREGNANCY: PREG TEST UR: NEGATIVE

## 2018-07-12 MED ORDER — MEDROXYPROGESTERONE ACETATE 150 MG/ML IM SUSP
150.0000 mg | Freq: Once | INTRAMUSCULAR | Status: AC
  Administered 2018-07-12: 150 mg via INTRAMUSCULAR

## 2018-07-12 NOTE — Progress Notes (Signed)
Pt here for 3rd depo injection 150 mg IM given lt deltoid. Tolerated well. Return 12 weeks for next injection. Having BTB on depo. Spoke [facility] a. Advise to make appointment.Pt understood. Pad CMA

## 2018-09-14 ENCOUNTER — Ambulatory Visit: Payer: Medicaid Other | Admitting: Adult Health

## 2018-09-17 ENCOUNTER — Other Ambulatory Visit: Payer: Self-pay

## 2018-09-17 ENCOUNTER — Emergency Department
Admission: EM | Admit: 2018-09-17 | Discharge: 2018-09-17 | Disposition: A | Discharge status: Home / Self Care | Payer: Medicaid Other | Attending: Student in an Organized Health Care Education/Training Program | Admitting: Student in an Organized Health Care Education/Training Program

## 2018-09-17 DIAGNOSIS — Z79899 Other long term (current) drug therapy: Secondary | ICD-10-CM | POA: Insufficient documentation

## 2018-09-17 DIAGNOSIS — F329 Major depressive disorder, single episode, unspecified: Secondary | ICD-10-CM | POA: Insufficient documentation

## 2018-09-17 DIAGNOSIS — Z87891 Personal history of nicotine dependence: Secondary | ICD-10-CM | POA: Diagnosis not present

## 2018-09-17 DIAGNOSIS — N3000 Acute cystitis without hematuria: Secondary | ICD-10-CM | POA: Diagnosis not present

## 2018-09-17 DIAGNOSIS — R1031 Right lower quadrant pain: Secondary | ICD-10-CM | POA: Diagnosis present

## 2018-09-17 LAB — URINALYSIS, MICROSCOPIC (REFLEX)

## 2018-09-17 LAB — URINALYSIS, ROUTINE W REFLEX MICROSCOPIC
Bilirubin Urine: NEGATIVE
Glucose, UA: NEGATIVE mg/dL
Ketones, ur: NEGATIVE mg/dL
Nitrite: POSITIVE — AB
PROTEIN: 30 mg/dL — AB
Specific Gravity, Urine: 1.02 (ref 1.005–1.030)
pH: 6 (ref 5.0–8.0)

## 2018-09-17 LAB — POCT PREGNANCY, URINE: Preg Test, Ur: NEGATIVE

## 2018-09-17 MED ORDER — SODIUM CHLORIDE 0.9 % IV BOLUS
1000.0000 mL | Freq: Once | INTRAVENOUS | Status: AC
  Administered 2018-09-17: 1000 mL via INTRAVENOUS

## 2018-09-17 MED ORDER — KETOROLAC TROMETHAMINE 30 MG/ML IJ SOLN
30.0000 mg | Freq: Once | INTRAMUSCULAR | Status: AC
  Administered 2018-09-17: 30 mg via INTRAVENOUS
  Filled 2018-09-17: qty 1

## 2018-09-17 MED ORDER — KETOROLAC TROMETHAMINE 10 MG PO TABS: 10.0000 mg | ORAL_TABLET | Freq: Three times a day (TID) | ORAL | 0 refills | Status: DC

## 2018-09-17 MED ORDER — ACETAMINOPHEN 325 MG PO TABS
650.0000 mg | ORAL_TABLET | Freq: Once | ORAL | Status: AC
  Administered 2018-09-17: 650 mg via ORAL
  Filled 2018-09-17: qty 2

## 2018-09-17 MED ORDER — CEFTRIAXONE SODIUM 1 G IJ SOLR
1.0000 g | Freq: Once | INTRAMUSCULAR | Status: AC
  Administered 2018-09-17: 1 g via INTRAVENOUS
  Filled 2018-09-17: qty 10

## 2018-09-17 MED ORDER — HYDROCODONE-ACETAMINOPHEN 5-325 MG PO TABS: 1.0000 | ORAL_TABLET | Freq: Three times a day (TID) | ORAL | 0 refills | Status: AC | PRN

## 2018-09-17 MED ORDER — CEPHALEXIN 500 MG PO CAPS: 500.0000 mg | ORAL_CAPSULE | Freq: Three times a day (TID) | ORAL | 0 refills | Status: AC

## 2018-09-17 NOTE — Discharge Instructions (Signed)
You are being treated for a UTI. Take the antibiotic as directed, and the pain medicine as needed. Follow-up with your provider or return to the ED for worsening, pain, fevers, flank pain, or difficulty urinating. Drink plenty of non-carbonated drinks and empty your bladder regularly.

## 2018-09-17 NOTE — ED Triage Notes (Signed)
Pt c/o of lower R back pain x 2 days. Denies urinary symptoms. Denies kidney stone hx. Appears uncomfortable.

## 2018-09-17 NOTE — ED Provider Notes (Signed)
[facility] Emergency Department Provider Note ____________________________________________  Time seen: 1056  I have reviewed the triage vital signs and the nursing notes.  HISTORY  Chief Complaint  Back Pain  HPI Stephanie Hodges is a 22 y.o. female presents to the ED accompanied by her girlfriend, for evaluation of a 2-day complaint of increasing right-sided flank pain.  Patient denies any known injury, accident, or trauma.  She denies any outright fevers, nausea, vomiting, or dysuria.  When further questioned, she has experienced some urinary hesitation and urgency.  She denies any frank hematuria, urinary retention, or frequency.  Denies any abdominal pain, pelvic pain, or vaginal discharge.  Past Medical History:  Diagnosis Date  . 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 Contraceptive education 10/03/2014  . Headache(784.0)    migraines  . History of chlamydia   . Nexplanon insertion 10/16/2014   Inserted left arm 10/16/14 remove 10/16/17  . No pertinent past medical history   . Pregnant   . Seasonal allergies     Patient Active Problem List   Diagnosis Date Noted  . 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 Encounter for Nexplanon removal 10/31/2017  . Nexplanon in place 10/24/2017  . 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 Abnormal uterine bleeding (AUB) 10/24/2017  . Encounter for initial prescription of contraceptive pills 10/24/2017  . Depression 10/24/2017  . Pregnancy examination or test, negative result 10/24/2017  . Well woman exam with routine gynecological exam 10/16/2014  . Contraceptive education 10/03/2014    Past Surgical History:  Procedure Laterality Date  . NO PAST SURGERIES      Prior to Admission medications   Medication Sig Start Date End Date Taking? Authorizing Provider  Acetaminophen (TYLENOL PO) Take by mouth as needed.    [provider]  aspirin 81 MG tablet Take 81 mg by mouth as  needed for pain.    [provider]  cephALEXin (KEFLEX) 500 MG capsule Take 1 capsule (500 mg total) by mouth 3 (three) times daily for 10 days. 09/17/18 09/27/18  Drury Ardizzone, Dannielle Karvonen, PA-C  cetirizine (ZYRTEC) 10 MG tablet Take 10 mg by mouth daily.    [provider]  escitalopram (LEXAPRO) 20 MG tablet Take 1 tablet (20 mg total) by mouth daily. 01/23/18   Florian Buff, MD  HYDROcodone-acetaminophen (NORCO) 5-325 MG tablet Take 1 tablet by mouth 3 (three) times daily as needed for up to 3 days. 09/17/18 09/20/18  Tabetha Haraway, Dannielle Karvonen, PA-C  ibuprofen (ADVIL,MOTRIN) 800 MG tablet Take 800 mg by mouth as needed.    [provider]  ketorolac (TORADOL) 10 MG tablet Take 1 tablet (10 mg total) by mouth every 8 (eight) hours. 09/17/18   Jahni Nazar, Dannielle Karvonen, PA-C  medroxyPROGESTERone (DEPO-PROVERA) 150 MG/ML injection Inject 1 mL (150 mg total) into the muscle every 3 (three) months. 01/23/18   Florian Buff, MD  zolpidem (AMBIEN) 10 MG tablet Take 1 tablet (10 mg total) by mouth at bedtime as needed for sleep. 01/23/18 02/22/18  Florian Buff, MD    Allergies Coconut flavor [flavoring agent] and Mushroom extract complex  Family History  Problem Relation Age of Onset  . Hypertension Mother   . Stroke Mother     Social History Social History   Tobacco Use  . Smoking status: Former Smoker    Packs/day: 0.22    Types: Cigarettes  . Smokeless tobacco: Never Used  Substance Use Topics  .  Alcohol use: No  . Drug use: No    Review of Systems  Constitutional: Negative for fever. Eyes: Negative for visual changes. ENT: Negative for sore throat. Cardiovascular: Negative for chest pain. Respiratory: Negative for shortness of breath. Gastrointestinal: Negative for abdominal pain, vomiting and diarrhea. Genitourinary: Negative for dysuria or hematuria.  Reports urinary hesitancy Musculoskeletal: Negative for back pain. Skin: Negative for  rash. Neurological: Negative for headaches, focal weakness or numbness. ____________________________________________  PHYSICAL EXAM:  VITAL SIGNS: ED Triage Vitals  Enc Vitals Group     BP 09/17/18 1010 137/79     Pulse Rate 09/17/18 1010 (!) 128     Resp 09/17/18 1010 18     Temp 09/17/18 1010 (!) 100.7 F (38.2 C)     Temp Source 09/17/18 1010 Oral     SpO2 09/17/18 1010 100 %     Weight 09/17/18 1010 172 lb (78 kg)     Height 09/17/18 1010 5\' 5"  (1.651 m)     Head Circumference --      Peak Flow --      Pain Score 09/17/18 1012 10     Pain Loc --      Pain Edu? --      Excl. in [place]? --     Constitutional: Alert and oriented. Well appearing and in no distress. Head: Normocephalic and atraumatic. Eyes: Conjunctivae are normal. Normal extraocular movements Cardiovascular: Normal rate, regular rhythm. Normal distal pulses. Respiratory: Normal respiratory effort. No wheezes/rales/rhonchi. Gastrointestinal: Soft and nontender. No distention, rebound, guarding, or rigidity.  Positive right flank pain on palpation. GU: deferred Musculoskeletal: Nontender with normal range of motion in all extremities.  Neurologic:  Normal gait without ataxia. Normal speech and language. No gross focal neurologic deficits are appreciated. Skin:  Skin is warm, dry and intact. No rash noted. ____________________________________________   LABS (pertinent positives/negatives)  Labs Reviewed  URINALYSIS, ROUTINE W REFLEX MICROSCOPIC - Abnormal; Notable for the following components:      Result Value   Hgb urine dipstick MODERATE (*)    Protein, ur 30 (*)    Nitrite POSITIVE (*)    Leukocytes, UA SMALL (*)    All other components within normal limits  URINALYSIS, MICROSCOPIC (REFLEX) - Abnormal; Notable for the following components:   Bacteria, UA RARE (*)    Non Squamous Epithelial PRESENT (*)    All other components within normal limits  URINE CULTURE  POCT PREGNANCY, URINE   ____________________________________________  PROCEDURES  Procedures NS 1000 ml bolus Ketorolac 30 mg IVP Ceftriaxone 1 g IVPB Acetaminophen 650 mg PO ____________________________________________  INITIAL IMPRESSION / ASSESSMENT AND PLAN / ED COURSE  Differential diagnosis includes, but is not limited to, ovarian cyst, ovarian torsion, acute appendicitis, diverticulitis, urinary tract infection/pyelonephritis, endometriosis, bowel obstruction, colitis, renal colic, gastroenteritis, hernia, fibroids, endometriosis, pregnancy related pain including ectopic pregnancy, etc.  Patient with ED evaluation of sudden onset of right-sided flank pain.  Patient's clinical picture is consistent with an acute cystitis.  She presents to the ED tachycardic, febrile at 100.7 F, with acute tenderness to the right flank.  Her urinalysis confirms nitrites as well as leukocytosis.  Patient is treated empirically for an acute cystitis.  Her urine is cultured and that result is pending at the time of discharge.  She should see the initial dose of Rocephin here in the ED, and will be discharged with a prescription for continuation of the same therapy.  Return precautions have been reviewed and the patient will see  her gynecologist or return to the ED as discussed. ____________________________________________  FINAL CLINICAL IMPRESSION(S) / ED DIAGNOSES  Final diagnoses:  Acute cystitis without hematuria      Carmie End, Dannielle Karvonen, PA-C 09/17/18 1639    Merlyn Lot, MD 09/18/18 1558

## 2018-09-19 LAB — URINE CULTURE
Culture: >=100000 — AB
Special Requests: NORMAL

## 2018-10-04 ENCOUNTER — Ambulatory Visit: Payer: Medicaid Other

## 2018-10-05 ENCOUNTER — Ambulatory Visit (INDEPENDENT_AMBULATORY_CARE_PROVIDER_SITE_OTHER): Payer: Medicaid Other | Admitting: *Deleted

## 2018-10-05 ENCOUNTER — Encounter: Payer: Self-pay | Admitting: *Deleted

## 2018-10-05 DIAGNOSIS — Z308 Encounter for other contraceptive management: Secondary | ICD-10-CM

## 2018-10-05 DIAGNOSIS — Z3202 Encounter for pregnancy test, result negative: Secondary | ICD-10-CM

## 2018-10-05 DIAGNOSIS — Z3042 Encounter for surveillance of injectable contraceptive: Secondary | ICD-10-CM | POA: Diagnosis not present

## 2018-10-05 LAB — POCT URINE PREGNANCY: Preg Test, Ur: NEGATIVE

## 2018-10-05 MED ORDER — MEDROXYPROGESTERONE ACETATE 150 MG/ML IM SUSP
150.0000 mg | Freq: Once | INTRAMUSCULAR | Status: AC
  Administered 2018-10-05: 150 mg via INTRAMUSCULAR

## 2018-10-05 NOTE — Progress Notes (Signed)
Pt here for Depo. Pt received shot in right hip. Pt tolerated shot well. Return in 12 weeks for next shot. [place]

## 2018-12-07 ENCOUNTER — Encounter: Payer: Self-pay | Admitting: Adult Health

## 2018-12-07 ENCOUNTER — Ambulatory Visit: Payer: Medicaid Other | Admitting: Adult Health

## 2018-12-07 VITALS — BP 130/87 | HR 97 | Ht 65.0 in | Wt 173.0 lb

## 2018-12-07 DIAGNOSIS — N939 Abnormal uterine and vaginal bleeding, unspecified: Secondary | ICD-10-CM | POA: Insufficient documentation

## 2018-12-07 DIAGNOSIS — Z8742 Personal history of other diseases of the female genital tract: Secondary | ICD-10-CM | POA: Insufficient documentation

## 2018-12-07 DIAGNOSIS — Z113 Encounter for screening for infections with a predominantly sexual mode of transmission: Secondary | ICD-10-CM | POA: Diagnosis not present

## 2018-12-07 DIAGNOSIS — Z30011 Encounter for initial prescription of contraceptive pills: Secondary | ICD-10-CM

## 2018-12-07 LAB — POCT URINE PREGNANCY: PREG TEST UR: NEGATIVE

## 2018-12-07 MED ORDER — NORETHIN ACE-ETH ESTRAD-FE 1-20 MG-MCG(24) PO CAPS: 1.0000 | ORAL_CAPSULE | Freq: Every day | ORAL | 3 refills | Status: DC

## 2018-12-07 NOTE — Progress Notes (Addendum)
Patient ID: Stephanie Hodges, female   DOB: [birth year], 23 y.o.   MRN: [id number] History of Present Illness: Stephanie Hodges is a 23 year old black female, single, G1P1, in complaining of vaginal bleeding since 11/15/18, is on depo.   Current Medications, Allergies, Past Medical History, Past Surgical History, Family History and Social History were reviewed in [employer] record.     Review of Systems: Vaginal bleeding since 11/15/18, is on depo, and next injection due 12/28/2018,but she wants to stop it and start OCs Had some pain when bleeding first started, none now Has new female partner. She has history of abnormal pap, is due repeat pap next month. She may want another child in future, has 23 yo    Physical Exam:BP 130/87 (BP Location: Left Arm, Patient Position: Sitting, Cuff Size: Normal)   Pulse 97   Ht 5\' 5"  (1.651 m)   Wt 173 lb (78.5 kg)   LMP 11/15/2018   BMI 28.79 kg/m   UPT negative  General:  Well developed, well nourished, no acute distress Skin:  Warm and dry Pelvic:  External genitalia is normal in appearance, no lesions.  The vagina is normal in appearance,has pinkish discharge. Urethra has no lesions or masses. The cervix is bulbous.  Uterus is felt to be normal size, shape, and contour.  No adnexal masses or tenderness noted.Bladder is non tender, no masses felt.GC/CHL obtained. Psych:  No mood changes, alert and cooperative,seems happy PHQ 2 score 1, is on lexapro. Fall risk is low. Examination chaperoned by Estill Bamberg Rash LPN. Will Rx Taytulla, start Sunday. Discussed condom use even with female partner.  Discussed IVF as option for later. Face time 15 minutes with 50 %counseling.  Impression:  1. Vaginal bleeding   2. Screening examination for STD (sexually transmitted disease)   3. Encounter for initial prescription of contraceptive pills   4. History of abnormal cervical Pap smear      Plan: GC/CHL sent Check HIV and RPR Meds ordered  this encounter  Medications  . Norethin Ace-Eth Estrad-FE (TAYTULLA) 1-20 MG-MCG(24) CAPS    Sig: Take 1 tablet by mouth daily.    Dispense:  84 capsule    Refill:  3    Order Specific Question:   Supervising Provider    Answer:   Florian Buff [[id number]]  Start Taytulla Sunday Cancel depo appt for 12/28/2018.  Return in 1 month for pap and physical

## 2018-12-08 LAB — RPR: RPR Ser Ql: NONREACTIVE

## 2018-12-08 LAB — HIV ANTIBODY (ROUTINE TESTING W REFLEX): HIV SCREEN 4TH GENERATION: NONREACTIVE

## 2018-12-12 LAB — GC/CHLAMYDIA PROBE AMP
Chlamydia trachomatis, NAA: NEGATIVE
NEISSERIA GONORRHOEAE BY PCR: NEGATIVE

## 2018-12-28 ENCOUNTER — Ambulatory Visit: Payer: Medicaid Other

## 2019-01-08 ENCOUNTER — Other Ambulatory Visit: Payer: Medicaid Other | Admitting: Adult Health

## 2019-01-11 ENCOUNTER — Encounter: Payer: Self-pay | Admitting: Adult Health

## 2019-01-11 ENCOUNTER — Other Ambulatory Visit (HOSPITAL_COMMUNITY)
Admission: RE | Admit: 2019-01-11 | Discharge: 2019-01-11 | Disposition: A | Discharge status: Home / Self Care | Payer: Medicaid Other | Source: Ambulatory Visit | Attending: Adult Health | Admitting: Adult Health

## 2019-01-11 ENCOUNTER — Ambulatory Visit: Payer: Medicaid Other | Admitting: Adult Health

## 2019-01-11 VITALS — BP 118/82 | HR 82 | Ht 65.0 in | Wt 174.0 lb

## 2019-01-11 DIAGNOSIS — R87610 Atypical squamous cells of undetermined significance on cytologic smear of cervix (ASC-US): Secondary | ICD-10-CM

## 2019-01-11 DIAGNOSIS — Z30017 Encounter for initial prescription of implantable subdermal contraceptive: Secondary | ICD-10-CM | POA: Insufficient documentation

## 2019-01-11 DIAGNOSIS — Z01411 Encounter for gynecological examination (general) (routine) with abnormal findings: Secondary | ICD-10-CM

## 2019-01-11 DIAGNOSIS — Z3041 Encounter for surveillance of contraceptive pills: Secondary | ICD-10-CM

## 2019-01-11 DIAGNOSIS — Z01419 Encounter for gynecological examination (general) (routine) without abnormal findings: Secondary | ICD-10-CM | POA: Insufficient documentation

## 2019-01-11 DIAGNOSIS — N6321 Unspecified lump in the left breast, upper outer quadrant: Secondary | ICD-10-CM

## 2019-01-11 DIAGNOSIS — Z124 Encounter for screening for malignant neoplasm of cervix: Secondary | ICD-10-CM | POA: Insufficient documentation

## 2019-01-11 DIAGNOSIS — N946 Dysmenorrhea, unspecified: Secondary | ICD-10-CM

## 2019-01-11 DIAGNOSIS — F419 Anxiety disorder, unspecified: Secondary | ICD-10-CM

## 2019-01-11 DIAGNOSIS — F329 Major depressive disorder, single episode, unspecified: Secondary | ICD-10-CM

## 2019-01-11 DIAGNOSIS — F32A Depression, unspecified: Secondary | ICD-10-CM

## 2019-01-11 DIAGNOSIS — R87810 Cervical high risk human papillomavirus (HPV) DNA test positive: Secondary | ICD-10-CM

## 2019-01-11 MED ORDER — ESCITALOPRAM OXALATE 20 MG PO TABS: 20.0000 mg | ORAL_TABLET | Freq: Every day | ORAL | 11 refills | Status: DC

## 2019-01-11 MED ORDER — NORETHIN ACE-ETH ESTRAD-FE 1-20 MG-MCG(24) PO CAPS: 1.0000 | ORAL_CAPSULE | Freq: Every day | ORAL | 3 refills | Status: DC

## 2019-01-11 MED ORDER — NAPROXEN 375 MG PO TABS: ORAL_TABLET | ORAL | 2 refills | Status: DC

## 2019-01-11 NOTE — Progress Notes (Signed)
Patient ID: Stephanie Hodges, female   DOB: June 08, 1996, 23 y.o.   MRN: [id number] History of Present Illness: Stephanie Hodges is a 23 year old black female, single in for well woman gyn exam and pap.Her last pap was 10/24/17 ASCUS with +HPV.She had colpo 01/23/18 with Dr Stephanie Hodges, no visible lesions seen.  No PCP.    Current Medications, Allergies, Past Medical History, Past Surgical History, Family History and Social History were reviewed in [employer] record.     Review of Systems: Patient denies any headaches, hearing loss, fatigue, blurred vision, shortness of breath, chest pain, abdominal pain, problems with bowel movements, urination, or intercourse. No joint pain or mood swings.Doing well on lexapro.  +period cramps, requested Toradol but will try naprosyn     Physical Exam:BP 118/82 (BP Location: Left Arm, Patient Position: Sitting, Cuff Size: Normal)   Pulse 82   Ht 5\' 5"  (1.651 m)   Wt 174 lb (78.9 kg)   LMP 01/03/2019 (Approximate)   BMI 28.96 kg/m  General:  Well developed, well nourished, no acute distress Skin:  Warm and dry Neck:  Midline trachea, normal thyroid, good ROM, no lymphadenopathy Lungs; Clear to auscultation bilaterally Breast:  No dominant palpable mass, retraction, or nipple discharge on right, on left has 2 x 3 cm mass at about 2 0' clock, tender had bx in 2017, no retraction or nipple discharge, has bilateral nipple rods. Cardiovascular: Regular rate and rhythm Abdomen:  Soft, non tender, no hepatosplenomegaly,has navel ring Pelvic:  External genitalia is normal in appearance, no lesions.  The vagina is normal in appearance. Urethra has no lesions or masses. The cervix is nulliparous, pap with GC/CHL and HPV performed. Uterus is felt to be normal size, shape, and contour.  No adnexal masses or tenderness noted.Bladder is non tender, no masses felt. Extremities/musculoskeletal:  No swelling or varicosities noted, no clubbing or cyanosis Psych:  No  mood changes, alert and cooperative,seems happy Fall risk is low PHQ 2 score 0. Examination chaperoned by Stephanie Pupa LPN. Will get left breast US.  Impression:    ICD-10-CM   1. Encounter for gynecological examination with Papanicolaou smear of cervix Z01.419   2. Routine cervical smear Z12.4 Cytology - PAP( [place])    CANCELED: Cytology - PAP( [place])  3. ASCUS with positive high risk HPV cervical R87.610    R87.810   4. Encounter for surveillance of contraceptive pills Z30.41   5. Mass of upper outer quadrant of left breast N63.21 US BREAST LTD UNI LEFT INC AXILLA  6. Anxiety and depression F41.9    F32.9   7. Menstrual cramps N94.6      Plan: Pap with HPV and GC/CHL sent Meds ordered this encounter  Medications  . escitalopram (LEXAPRO) 20 MG tablet    Sig: Take 1 tablet (20 mg total) by mouth daily.    Dispense:  30 tablet    Refill:  11    Order Specific Question:   Supervising Provider    Answer:   Stephanie Hodges, Stephanie Hodges [[id number]]  . Norethin Ace-Eth Estrad-FE (TAYTULLA) 1-20 MG-MCG(24) CAPS    Sig: Take 1 tablet by mouth daily.    Dispense:  84 capsule    Refill:  3    Order Specific Question:   Supervising Provider    Answer:   Stephanie Hodges, Stephanie Hodges [[id number]]  . naproxen (NAPROSYN) 375 MG tablet    Sig: Take 1 every 8 hours prn cramps    Dispense:  30 tablet  Refill:  2    Order Specific Question:   Supervising Provider    Answer:   Stephanie Hodges [[id number]]  Left breast US scheduled 2/18 at 3:50 pm at [facility] in 1 year Pap in 3 if normal

## 2019-01-15 LAB — CYTOLOGY - PAP
Chlamydia: NEGATIVE
HPV: DETECTED — AB
Neisseria Gonorrhea: NEGATIVE

## 2019-01-16 ENCOUNTER — Telehealth: Payer: Self-pay | Admitting: Adult Health

## 2019-01-16 ENCOUNTER — Ambulatory Visit (HOSPITAL_COMMUNITY)
Admission: RE | Admit: 2019-01-16 | Discharge: 2019-01-16 | Disposition: A | Discharge status: Home / Self Care | Payer: Medicaid Other | Source: Ambulatory Visit | Attending: Adult Health | Admitting: Adult Health

## 2019-01-16 ENCOUNTER — Encounter: Payer: Self-pay | Admitting: Adult Health

## 2019-01-16 DIAGNOSIS — N6321 Unspecified lump in the left breast, upper outer quadrant: Secondary | ICD-10-CM | POA: Insufficient documentation

## 2019-01-16 DIAGNOSIS — R8789 Other abnormal findings in specimens from female genital organs: Secondary | ICD-10-CM | POA: Insufficient documentation

## 2019-01-16 DIAGNOSIS — R87618 Other abnormal cytological findings on specimens from cervix uteri: Secondary | ICD-10-CM | POA: Insufficient documentation

## 2019-01-16 HISTORY — DX: Other abnormal cytological findings on specimens from cervix uteri: R87.618

## 2019-01-16 NOTE — Telephone Encounter (Signed)
Pt aware of pap will make colpo appt  

## 2019-01-16 NOTE — Telephone Encounter (Signed)
Left message to call, pap came back abnormal and needs colpo.

## 2019-01-18 ENCOUNTER — Telehealth: Payer: Self-pay | Admitting: Adult Health

## 2019-01-18 DIAGNOSIS — N6321 Unspecified lump in the left breast, upper outer quadrant: Secondary | ICD-10-CM

## 2019-01-18 NOTE — Telephone Encounter (Signed)
Pt aware that tubular adenoma in left breast is stable, but pt wants it removed will refer to Dr Constance Haw.

## 2019-01-25 ENCOUNTER — Encounter: Payer: Medicaid Other | Admitting: Obstetrics & Gynecology

## 2019-02-06 ENCOUNTER — Encounter: Payer: Self-pay | Admitting: Obstetrics and Gynecology

## 2019-02-06 ENCOUNTER — Other Ambulatory Visit (HOSPITAL_COMMUNITY): Payer: Self-pay | Admitting: General Surgery

## 2019-02-06 ENCOUNTER — Ambulatory Visit (INDEPENDENT_AMBULATORY_CARE_PROVIDER_SITE_OTHER): Payer: Medicaid Other | Admitting: General Surgery

## 2019-02-06 ENCOUNTER — Ambulatory Visit: Payer: Medicaid Other | Admitting: Obstetrics and Gynecology

## 2019-02-06 ENCOUNTER — Encounter: Payer: Self-pay | Admitting: General Surgery

## 2019-02-06 VITALS — BP 130/81 | HR 96 | Ht 65.0 in | Wt 175.0 lb

## 2019-02-06 VITALS — BP 132/94 | HR 80 | Temp 97.3°F | Resp 18 | Wt 173.6 lb

## 2019-02-06 DIAGNOSIS — IMO0002 Reserved for concepts with insufficient information to code with codable children: Secondary | ICD-10-CM

## 2019-02-06 DIAGNOSIS — R229 Localized swelling, mass and lump, unspecified: Principal | ICD-10-CM

## 2019-02-06 DIAGNOSIS — D242 Benign neoplasm of left breast: Secondary | ICD-10-CM | POA: Diagnosis not present

## 2019-02-06 DIAGNOSIS — Z3202 Encounter for pregnancy test, result negative: Secondary | ICD-10-CM | POA: Diagnosis not present

## 2019-02-06 LAB — POCT URINE PREGNANCY: Preg Test, Ur: NEGATIVE

## 2019-02-06 NOTE — Progress Notes (Signed)
[facility] History and Physical  Reason for Referral: Left breast mass  Referring Physician:   Derrek Monaco PA    Stephanie Hodges is a 23 y.o. female.  HPI: Stephanie Hodges is a 55 yo relatively healthy patient with a history of a tubular adenoma of the left breast that has been there for several years and has been imaged regularly. She also had a biopsy that demonstrated that this was a tubular adenoma.  She reports that this area is painful and that the pain is not associated with her cycles and is sporadic. She reports that she has no nipple drainage or changes and no other masses of the breast. She is adopted and is also worried about the mass.    The pain is a dull ache in the left breast and pain with palpation. She does not drink a lot of caffeine or eat a lot of chocolate.   Past Medical History:  Diagnosis Date  . 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 Contraceptive education 10/03/2014  . Headache(784.0)    migraines  . History of chlamydia   . Nexplanon insertion 10/16/2014   Inserted left arm 10/16/14 remove 10/16/17  . No pertinent past medical history   . Pregnant   . Seasonal allergies     Past Surgical History:  Procedure Laterality Date  . NO PAST SURGERIES     Patient reported to [place] that she was adopted.  Family History  Problem Relation Age of Onset  . Hypertension Mother   . Stroke Mother     Social History   Tobacco Use  . Smoking status: Former Smoker    Packs/day: 0.22    Types: Cigarettes  . Smokeless tobacco: Never Used  Substance Use Topics  . Alcohol use: No  . Drug use: No    Medications: I have reviewed the patient's current medications. Allergies as of 02/06/2019      Reactions   Coconut Flavor [flavoring Agent] Swelling, Rash   Mushroom Extract Complex Rash        Medication List       Accurate as of February 06, 2019 10:26 AM. Always use your most recent med list.        escitalopram 20 MG tablet Commonly known as:  Lexapro Take 1 tablet (20 mg total) by mouth daily.   ibuprofen 800 MG tablet Commonly known as:  ADVIL,MOTRIN Take 800 mg by mouth as needed.   naproxen 375 MG tablet Commonly known as:  NAPROSYN Take 1 every 8 hours prn cramps   Norethin Ace-Eth Estrad-FE 1-20 MG-MCG(24) Caps Commonly known as:  Taytulla Take 1 tablet by mouth daily.   TYLENOL PO Take by mouth as needed.        ROS:  A comprehensive review of systems was negative except for: Integument/breast: positive for breast lump and breast tenderness  Blood pressure (!) 132/94, pulse 80, temperature (!) 97.3 F (36.3 C), temperature source Temporal, resp. rate 18, weight 173 lb 9.6 oz (78.7 kg). Physical Exam Vitals signs reviewed.  Constitutional:      Appearance: Normal appearance.  HENT:     Head: Normocephalic and atraumatic.     Nose: Nose normal.     Mouth/Throat:     Mouth: Mucous membranes are moist.  Eyes:     Extraocular Movements: Extraocular movements intact.  Pupils: Pupils are equal, round, and reactive to light.  Neck:     Musculoskeletal: Normal range of motion. No neck rigidity.  Cardiovascular:     Rate and Rhythm: Normal rate and regular rhythm.  Pulmonary:     Effort: Pulmonary effort is normal.     Breath sounds: Normal breath sounds.  Chest:     Breasts:        Right: Normal. No inverted nipple, mass, nipple discharge, skin change or tenderness.        Left: Tenderness present. No inverted nipple, mass, nipple discharge or skin change.     Comments: Tenderness in the 4 olcock position of the left breast about 2 cm from the nipple as the [place] indicates, but difficult to appreciate any discrete mass in the area  Abdominal:     General: There is no distension.     Palpations: Abdomen is soft.     Tenderness: There is no  abdominal tenderness.  Musculoskeletal: Normal range of motion.        General: No swelling.  Skin:    General: Skin is warm and dry.  Neurological:     General: No focal deficit present.     Mental Status: She is alert and oriented to person, place, and time.  Psychiatric:        Mood and Affect: Mood normal.        Behavior: Behavior normal.        Thought Content: Thought content normal.        Judgment: Judgment normal.    Results: [place] Left breast 12/2018 FINDINGS: Targeted LEFT breast ultrasound is performed, showing the biopsy-proven tubular adenoma at the 4 o'clock position approximately 2 cm from the nipple at MIDDLE to POSTERIOR depth which measures approximately 1.1 x 3.4 x 2.6 cm. In October, 2017, the mass measured approximately 1.2 x 3.4 x 2.8 cm. In October, 2016, the mass measured approximately 1.2 x 3.3 x 3.0 cm.  IMPRESSION: Biopsy-proven tubular adenoma involving the LOWER OUTER QUADRANT of the LEFT breast which is unchanged in size dating back to October, 2016.  RECOMMENDATION: No further imaging follow-up is felt necessary unless there are persistent or intervening clinical concerns.  The patient did verbalize some concern about the mass despite its stability. We discussed the option of surgical excision. She stated that she will contact her primary physician should she decide that she would like to have the mass excised.  I have discussed the findings and recommendations with the patient. Results were also provided in writing at the conclusion of the visit.  BI-RADS CATEGORY  2: Benign.  Pathology 08/2016  Diagnosis Breast, left, needle core biopsy, 4:00 - TUBULAR ADENOMA. - NO EVIDENCE OF MALIGNANCY. - SEE MICROSCOPIC DESCRIPTION. Microscopic Comment Called to [facility] on 09/30/16. (JDP:ds 09/30/16)  Assessment & Plan:  Stephanie Hodges is a 23 y.o. female with a tubular adenoma of the left breast that we have  discussed and I have reiterated that this is a benign process. She is having pain and discomfort from this area and is also somewhat anxious about it due to her adoption and lack of knowledge of her family history.  We discussed breast pain and conservative measures for dealing with breast pain, but she would like to have this removed. I do not feel any discrete mass, and therefore we would need to get needle localization to ensure that I am removing the tubular adenoma.    -Excisional biopsy after  needle localization   All questions were answered to the satisfaction of the patient.  The risk and benefits of excisional breast biopsy after needle localization were discussed including but not limited to bleeding, infection, cosmetic changes to the breast, risk of recurrence of other masses, risk of finding something that is more worrisome/ cancer although unlikely.  After careful consideration, Jackquelyn Sundberg has decided to proceed.    Virl Cagey 02/06/2019, 10:26 AM

## 2019-02-06 NOTE — Progress Notes (Addendum)
Patient ID: Stephanie Hodges, female   DOB: [birth year], 23 y.o.   MRN: [id number]  Discussion: 1. Discussed with pt ASCCP with guideline reviewed for young age group age 37-24.. Will Follow up with annual PAP as per guideline.Pt had colpo 12/2017, no identified lesions .  At end of discussion, pt had opportunity to ask questions and has no further questions at this time.   Specific discussion of Annual PAP as noted above. Greater than 50% was spent in counseling and coordination of care with the patient.   Total time greater than: 10 minutes.   By signing my name below, I, Samul Dada, attest that this documentation has been prepared under the direction and in the presence of Jonnie Kind, MD. Electronically Signed: [place]. 02/06/19. 4:02 PM.  I personally performed the services described in this documentation, which was SCRIBED in my presence. The recorded information has been reviewed and considered accurate. It has been edited as necessary during review. Jonnie Kind, MD

## 2019-02-06 NOTE — Patient Instructions (Signed)
What are the treatments for breast pain? Use less salt. Wear a supportive bra. Apply local heat to the painful area. Take over-the-counter pain relievers sparingly, as needed. Avoid caffeine. Well-designed studies have not shown that avoiding caffeine can treat breast pain. However, many women report significant improvement in their symptoms when they reduce their intake of tea, coffee, chocolate, and energy drinks. Try Vitamin E. Studies have not consistently shown benefits of vitamin E for treating breast pain, though some women find it helpful. Using vitamin E for a few weeks to see if it will help is unlikely to cause any harm. However, long-term use of vitamin E supplementation is not recommended for breast pain, as there are some studies suggesting this may not be safe. Try evening primrose oil. Similar to vitamin E, studies have not consistently shown evening primrose oil to be helpful in treating breast pain, though it does help some women. Evening primrose oil is found over-the-counter. Side effects might include nausea, diarrhea, and headaches. In the past, there was concern that certain patients might be at increased risk of seizures when taking this supplement, though this is now disputed. Try Omega-3 fatty acid. Though not proven to be effective in rigorous studies, some women find increased intake of fish oils/omega-3 supplements to be helpful. Natural dietary sources include: dark green leafy vegetables, ocean-raised ("wild") cold-water fish, flax, walnuts, and sesame. Omega-3 supplements are also available by prescription and over-the-counter. Give it time. Most commonly, pain goes away on its own after a few months, without the need for any treatment.  Plans for Excisional Breast Biopsy after Needle Localization.   Breast Biopsy A breast biopsy is a procedure in which a sample of breast tissue is removed from the breast and examined under a microscope to see if cancerous cells are  present. You may need a breast biopsy if you have:  Any undiagnosed breast mass (tumor).  Nipple abnormalities, dimpling, crusting, or ulcerations.  Abnormal discharge from the nipple, especially blood.  Redness, swelling, and pain of the breast.  Calcium deposits (calcifications) or abnormalities seen on a mammogram, ultrasound results, or MRI results.  Abnormal changes in the breast seen on your mammogram. If the breast abnormality is found to be cancerous (malignant), a breast biopsy can help to determine what the best treatment is for you. There are many different types of breast biopsies. Talk with your health care provider about your options and which type is best for you. Tell a health care provider about:  Any allergies you have.  All medicines you are taking, including vitamins, herbs, eye drops, creams, and over-the-counter medicines.  Any problems you or family members have had with anesthetic medicines.  Any blood disorders you have.  Any surgeries you have had.  Any medical conditions you have.  Whether you are pregnant or may be pregnant. What are the risks? Generally, this is a safe procedure. However, problems may occur, including:  Discomfort. This is temporary.  Bruising and swelling of the breast.  Changes in the shape of the breast.  Bleeding.  Infection.  Damage to other tissues.  Allergic reactions to medicines.  Needing more surgery. What happens before the procedure? Medicines Ask your health care provider about:  Changing or stopping your regular medicines. This is especially important if you are taking diabetes medicines or blood thinners.  Taking medicines such as aspirin and ibuprofen. These medicines can thin your blood. Do not take these medicines unless your health care provider tells you to take  them.  Taking over-the-counter medicines, vitamins, herbs, and supplements. Lifestyle  Do not use any products that contain nicotine  or tobacco, such as cigarettes, e-cigarettes, and chewing tobacco. If you need help quitting, ask your health care provider.  Do not drink alcohol for 24 hours before the procedure.  Wear a good support bra to the procedure. Eating and drinking restrictions Talk to your health care provider about when you should stop eating and drinking.  You may be asked not to drink or eat for 2-8 hours before the breast biopsy.  In some cases, you may be allowed to eat a light breakfast. General instructions  Plan to have someone take you home from the hospital or clinic.  Ask your health care provider how your surgical site will be marked or identified.  Ask your health care provider what steps will be taken to help prevent infection. These may include: ? Removing hair at the surgery site. ? Washing skin with a germ-killing soap.  Your health care provider may do a procedure to locate and mark the tumor area in your breast (localization). This will help guide your surgeon to where the biopsy or incision is made. This may be done with: ? Imaging, such as a mammogram, ultrasound, or MRI. ? Insertion of special wire, clip, seed, or radar reflector implant in the tumor area. What happens during the procedure?   You may be given one or more of the following: ? A medicine to numb the breast area (local anesthetic). ? A medicine to help you relax (sedative). ? A medicine to make you fall asleep (general anesthetic).  Your health care provider will perform the biopsy using only one of the following methods. He or she will do: ? Fine needle aspiration. A thin needle with a syringe will be inserted into a breast cyst. Fluid and cells will be removed. ? Core needle biopsy. A wide, hollow needle (core needle) will be inserted into a breast lump multiple times to remove tissue samples or cores. ? Stereotactic biopsy. X-rays and a computer will be used to locate the breast lump. The surgeon will use the  X-ray images to collect several samples of tissue using a needle. ? Vacuum-assisted biopsy. A small incision will be made in your breast. A hollow needle and vacuum will be passed through the incision and into the breast tissue. The vacuum will gently draw abnormal breast tissue into the needle to remove it. ? Ultrasound-guided core needle biopsy. An ultrasound will be used to help guide the core needle to the area of the mass or abnormality. An incision will be made to insert the needle. Then tissue samples will be removed. ? Surgical biopsy. An incision will be made in the breast to remove part or all of the abnormal tissue. After the tissue is removed, the skin over the area will be closed with sutures and covered with a dressing. There are two types of surgical biopsies:  Incisional biopsy. The surgeon will remove part of the breast lump.  Excisional biopsy. The surgeon will attempt to remove the whole breast lump or as much of it as possible. After any of these procedures, the tissue or fluid that was removed will be examined under a microscope. The procedure may vary among health care providers and hospitals. What happens after the procedure?  You will be taken to the recovery area. If you are doing well and have no problems, you will be allowed to go home.  You may have  a pressure dressing applied on your breast for 24-48 hours. You may also be advised to wear a supportive bra during this time.  Do not drive for 24 hours if you were given a sedative during your procedure. Summary  A breast biopsy is a procedure in which a sample of breast tissue is removed from the breast and examined under a microscope to see if cancerous cells are present.  This is a safe procedure, but problems can occur, including bleeding, infection, pain, and bruising.  Ask your health care provider about changing or stopping your regular medicines.  Plan to have someone take you home from the hospital or  clinic. This information is not intended to replace advice given to you by your health care provider. Make sure you discuss any questions you have with your health care provider. Document Released: 11/15/2005 Document Revised: 05/03/2018 Document Reviewed: 05/03/2018 Elsevier Interactive Patient Education  2019 [employer].

## 2019-02-09 NOTE — H&P (Signed)
[facility] History and Physical  Reason for Referral: Left breast mass  Referring Physician:   Derrek Monaco PA   Stephanie Hodges is a 23 y.o. female.  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 She also had a biopsy that demonstrated that this was a tubular adenoma.  She reports that this area is painful and that the pain is not associated with her cycles and is sporadic. She reports that she has no nipple drainage or changes and no other masses of the breast. She is adopted and is also worried about the mass.    The pain is a dull ache in the left breast and pain with palpation. She does not drink a lot of caffeine or eat a lot of chocolate.       Past Medical History:  Diagnosis Date  . 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 Contraceptive education 10/03/2014  . Headache(784.0)    migraines  . History of chlamydia   . Nexplanon insertion 10/16/2014   Inserted left arm 10/16/14 remove 10/16/17  . No pertinent past medical history   . Pregnant   . Seasonal allergies          Past Surgical History:  Procedure Laterality Date  . NO PAST SURGERIES     Patient reported to [place] that she was adopted.       Family History  Problem Relation Age of Onset  . Hypertension Mother   . Stroke Mother     Social History        Tobacco Use  . Smoking status: Former Smoker    Packs/day: 0.22    Types: Cigarettes  . Smokeless tobacco: Never Used  Substance Use Topics  . Alcohol use: No  . Drug use: No    Medications: I have reviewed the patient's current medications.      Allergies as of 02/06/2019      Reactions   Coconut Flavor [flavoring Agent]  Swelling, Rash   Mushroom Extract Complex Rash         Medication List       Accurate as of February 06, 2019 10:26 AM. Always use your most recent med list.        escitalopram 20 MG tablet Commonly known as:  Lexapro Take 1 tablet (20 mg total) by mouth daily.   ibuprofen 800 MG tablet Commonly known as:  ADVIL,MOTRIN Take 800 mg by mouth as needed.   naproxen 375 MG tablet Commonly known as:  NAPROSYN Take 1 every 8 hours prn cramps   Norethin Ace-Eth Estrad-FE 1-20 MG-MCG(24) Caps Commonly known as:  Taytulla Take 1 tablet by mouth daily.   TYLENOL PO Take by mouth as needed.        ROS:  A comprehensive review of systems was negative except for: Integument/breast: positive for breast lump and breast tenderness  Blood pressure (!) 132/94, pulse 80, temperature (!) 97.3 F (36.3 C), temperature source Temporal, resp. rate 18, weight 173 lb 9.6 oz (78.7 kg). Physical Exam Vitals signs reviewed.  Constitutional:      Appearance: Normal appearance.  HENT:     Head: Normocephalic and atraumatic.     Nose: Nose normal.  Mouth/Throat:     Mouth: Mucous membranes are moist.  Eyes:     Extraocular Movements: Extraocular movements intact.     Pupils: Pupils are equal, round, and reactive to light.  Neck:     Musculoskeletal: Normal range of motion. No neck rigidity.  Cardiovascular:     Rate and Rhythm: Normal rate and regular rhythm.  Pulmonary:     Effort: Pulmonary effort is normal.     Breath sounds: Normal breath sounds.  Chest:     Breasts:        Right: Normal. No inverted nipple, mass, nipple discharge, skin change or tenderness.        Left: Tenderness present. No inverted nipple, mass, nipple discharge or skin change.     Comments: Tenderness in the 4 olcock position of the left breast about 2 cm from the nipple as the [place] indicates, but difficult to appreciate any discrete mass in the area  Abdominal:     General: There is no  distension.     Palpations: Abdomen is soft.     Tenderness: There is no abdominal tenderness.  Musculoskeletal: Normal range of motion.        General: No swelling.  Skin:    General: Skin is warm and dry.  Neurological:     General: No focal deficit present.     Mental Status: She is alert and oriented to person, place, and time.  Psychiatric:        Mood and Affect: Mood normal.        Behavior: Behavior normal.        Thought Content: Thought content normal.        Judgment: Judgment normal.    Results: [place] Left breast 12/2018 FINDINGS: Targeted LEFT breast ultrasound is performed, showing the biopsy-proven tubular adenoma at the 4 o'clock position approximately 2 cm from the nipple at MIDDLE to POSTERIOR depth which measures approximately 1.1 x 3.4 x 2.6 cm. In October, 2017, the mass measured approximately 1.2 x 3.4 x 2.8 cm. In October, 2016, the mass measured approximately 1.2 x 3.3 x 3.0 cm.  IMPRESSION: Biopsy-proven tubular adenoma involving the LOWER OUTER QUADRANT of the LEFT breast which is unchanged in size dating back to October, 2016.  RECOMMENDATION: No further imaging follow-up is felt necessary unless there are persistent or intervening clinical concerns.  The patient did verbalize some concern about the mass despite its stability. We discussed the option of surgical excision. She stated that she will contact her primary physician should she decide that she would like to have the mass excised.  I have discussed the findings and recommendations with the patient. Results were also provided in writing at the conclusion of the visit.  BI-RADS CATEGORY 2: Benign.  Pathology 08/2016  Diagnosis Breast, left, needle core biopsy, 4:00 - TUBULAR ADENOMA. - NO EVIDENCE OF MALIGNANCY. - SEE MICROSCOPIC DESCRIPTION. Microscopic Comment Called to [facility] on 09/30/16. (JDP:ds 09/30/16)  Assessment & Plan:  Stephanie Hodges is a  23 y.o. female with a tubular adenoma of the left breast that we have discussed and I have reiterated that this is a benign process. She is having pain and discomfort from this area and is also somewhat anxious about it due to her adoption and lack of knowledge of her family history.  We discussed breast pain and conservative measures for dealing with breast pain, but she would like to have this removed. I do not feel any discrete mass,  and therefore we would need to get needle localization to ensure that I am removing the tubular adenoma.    -Excisional biopsy after needle localization   All questions were answered to the satisfaction of the patient.  The risk and benefits of excisional breast biopsy after needle localization were discussed including but not limited to bleeding, infection, cosmetic changes to the breast, risk of recurrence of other masses, risk of finding something that is more worrisome/ cancer although unlikely.  After careful consideration, Stephanie Hodges has decided to proceed.    Stephanie Hodges 02/06/2019, 10:26 AM

## 2019-02-12 NOTE — Patient Instructions (Signed)
Stephanie Hodges  02/12/2019     @PREFPERIOPPHARMACY @   Your procedure is scheduled on  02/21/2019 .  Report to [facility] at  [place].  Call this number if you have problems the morning of surgery:  [phone]   Remember:  Do not eat or drink after midnight.                       Take these medicines the morning of surgery with A SIP OF WATER  lexapro.    Do not wear jewelry, make-up or nail polish.  Do not wear lotions, powders, or perfumes, or deodorant.  Do not shave 48 hours prior to surgery.  Men may shave face and neck.  Do not bring valuables to the hospital.  [facility] is not responsible for any belongings or valuables.  Contacts, dentures or bridgework may not be worn into surgery.  Leave your suitcase in the car.  After surgery it may be brought to your room.  For patients admitted to the hospital, discharge time will be determined by your treatment team.  Patients discharged the day of surgery will not be allowed to drive home.   Name and phone number of your driver:   family Special instructions:  None  Please read over the following fact sheets that you were given. Anesthesia Post-op Instructions and Care and Recovery After Surgery       Breast Biopsy, Care After These instructions give you information about caring for yourself after your procedure. Your doctor may also give you more specific instructions. Call your doctor if you have any problems or questions after your procedure. What can I expect after the procedure? After your procedure, it is common to have:  Bruising on your breast.  Numbness, tingling, or pain near your biopsy site. Follow these instructions at home: Medicines  Take over-the-counter and prescription medicines only as told by your doctor.  Do not drive for 24 hours if you were given a medicine to help you relax (sedative) during your procedure.  Do not drink alcohol while taking pain medicine.  Do not drive or  use heavy machinery while taking prescription pain medicine. Biopsy site care      Follow instructions from your doctor about how to take care of your cut from surgery (incision) or your puncture area. Make sure you: ? Wash your hands with soap and water before you change your bandage (dressing). If you cannot use soap and water, use hand sanitizer. ? Change your bandage as told by your doctor. ? Leave stitches (sutures), skin glue, or skin tape (adhesive strips) in place. They may need to stay in place for 2 weeks or longer. If tape strips get loose and curl up, you may trim the loose edges. Do not remove tape strips completely unless your doctor says it is okay.  If you have stitches, keep them dry when you take a bath or a shower.  Check your cut or puncture area every day for signs of infection. Check for: ? Redness, swelling, or pain. ? Fluid or blood. ? Warmth. ? Pus or a bad smell.  Protect the biopsy area. Do not let the area get bumped. Activity  If you had a cut during your procedure, avoid activities that could pull your cut open. These include: ? Stretching. ? Reaching over your head. ? Exercise. ? Sports. ? Lifting anything that weighs more than 3 lb (1.4 kg).  Return to your normal activities as told by your doctor. Ask your doctor what activities are safe for you. Managing pain, stiffness, and swelling If told, put ice on the biopsy site to relieve swelling:  Put ice in a plastic bag.  Place a towel between your skin and the bag.  Leave the ice on for 20 minutes, 2-3 times a day. General instructions  Continue your normal diet.  Wear a good support bra for as long as told by your doctor.  Get checked for extra fluid around your lymph nodes (lymphedema) as often as told by your doctor.  Keep all follow-up visits as told by your doctor. This is important. Contact a doctor if:  You notice any of the following at the biopsy site: ? More redness, swelling,  or pain. ? More fluid or blood coming from the site. ? The site feels warm to the touch. ? Pus or a bad smell coming from the site. ? The site breaks open after the stitches or skin tape strips have been removed.  You have a rash.  You have a fever. Get help right away if:  You have more bleeding from the biopsy site. Get help right away if bleeding is more than a small spot.  You have trouble breathing.  You have red streaks around the biopsy site. Summary  After your procedure, it is common to have bruising, numbness, tingling, or pain near the biopsy site.  Do not drive or use heavy machinery while taking prescription pain medicine.  Wear a good support bra for as long as told by your doctor.  If you had a cut during your procedure, avoid activities that may pull the cut open. Ask your doctor what activities are safe for you. This information is not intended to replace advice given to you by your health care provider. Make sure you discuss any questions you have with your health care provider. Document Released: 09/11/2009 Document Revised: 05/04/2018 Document Reviewed: 05/04/2018 Elsevier Interactive Patient Education  2019 [place] Anesthesia, Adult, Care After This sheet gives you information about how to care for yourself after your procedure. Your health care provider may also give you more specific instructions. If you have problems or questions, contact your health care provider. What can I expect after the procedure? After the procedure, the following side effects are common:  Pain or discomfort at the IV site.  Nausea.  Vomiting.  Sore throat.  Trouble concentrating.  Feeling cold or chills.  Weak or tired.  Sleepiness and fatigue.  Soreness and body aches. These side effects can affect parts of the body that were not involved in surgery. Follow these instructions at home:  For at least 24 hours after the procedure:  Have a responsible  adult stay with you. It is important to have someone help care for you until you are awake and alert.  Rest as needed.  Do not: ? Participate in activities in which you could fall or become injured. ? Drive. ? Use heavy machinery. ? Drink alcohol. ? Take sleeping pills or medicines that cause drowsiness. ? Make important decisions or sign legal documents. ? Take care of children on your own. Eating and drinking  Follow any instructions from your health care provider about eating or drinking restrictions.  When you feel hungry, start by eating small amounts of foods that are soft and easy to digest (bland), such as toast. Gradually return to your regular diet.  Drink enough fluid to keep  your urine pale yellow.  If you vomit, rehydrate by drinking water, juice, or clear broth. General instructions  If you have sleep apnea, surgery and certain medicines can increase your risk for breathing problems. Follow instructions from your health care provider about wearing your sleep device: ? Anytime you are sleeping, including during daytime naps. ? While taking prescription pain medicines, sleeping medicines, or medicines that make you drowsy.  Return to your normal activities as told by your health care provider. Ask your health care provider what activities are safe for you.  Take over-the-counter and prescription medicines only as told by your health care provider.  If you smoke, do not smoke without supervision.  Keep all follow-up visits as told by your health care provider. This is important. Contact a health care provider if:  You have nausea or vomiting that does not get better with medicine.  You cannot eat or drink without vomiting.  You have pain that does not get better with medicine.  You are unable to pass urine.  You develop a skin rash.  You have a fever.  You have redness around your IV site that gets worse. Get help right away if:  You have difficulty  breathing.  You have chest pain.  You have blood in your urine or stool, or you vomit blood. Summary  After the procedure, it is common to have a sore throat or nausea. It is also common to feel tired.  Have a responsible adult stay with you for the first 24 hours after general anesthesia. It is important to have someone help care for you until you are awake and alert.  When you feel hungry, start by eating small amounts of foods that are soft and easy to digest (bland), such as toast. Gradually return to your regular diet.  Drink enough fluid to keep your urine pale yellow.  Return to your normal activities as told by your health care provider. Ask your health care provider what activities are safe for you. This information is not intended to replace advice given to you by your health care provider. Make sure you discuss any questions you have with your health care provider. Document Released: 02/21/2001 Document Revised: 07/01/2017 Document Reviewed: 07/01/2017 Elsevier Interactive Patient Education  2019 [employer].

## 2019-02-15 ENCOUNTER — Encounter (HOSPITAL_COMMUNITY)
Admission: RE | Admit: 2019-02-15 | Discharge: 2019-02-15 | Disposition: A | Discharge status: Home / Self Care | Payer: Medicaid Other | Source: Ambulatory Visit | Attending: General Surgery | Admitting: General Surgery

## 2019-02-15 ENCOUNTER — Encounter (HOSPITAL_COMMUNITY): Payer: Self-pay

## 2019-02-21 ENCOUNTER — Encounter (HOSPITAL_COMMUNITY): Payer: Medicaid Other

## 2019-02-21 ENCOUNTER — Encounter (HOSPITAL_COMMUNITY): Admission: RE | Payer: Self-pay | Source: Home / Self Care

## 2019-02-21 ENCOUNTER — Ambulatory Visit (HOSPITAL_COMMUNITY): Admission: RE | Admit: 2019-02-21 | Payer: Medicaid Other | Source: Home / Self Care | Admitting: General Surgery

## 2019-02-21 SURGERY — BREAST BIOPSY WITH NEEDLE LOCALIZATION
Anesthesia: General | Laterality: Left

## 2019-03-21 IMAGING — US ULTRASOUND LEFT BREAST LIMITED
1 series · 3 of 3 positions shown · non-contrast
Comparison: Previous exam(s).

CLINICAL DATA: 22-year-old who had a LEFT breast mass that
underwent biopsy in August 2016 with pathology revealing a benign
tubular adenoma. Evaluate for possible enlargement.

EXAM:
ULTRASOUND OF THE LEFT BREAST

[Series 1: ultrasound left breast limited · 0.07mm/px · 3 of 3 slices shown]
[im 1/3]
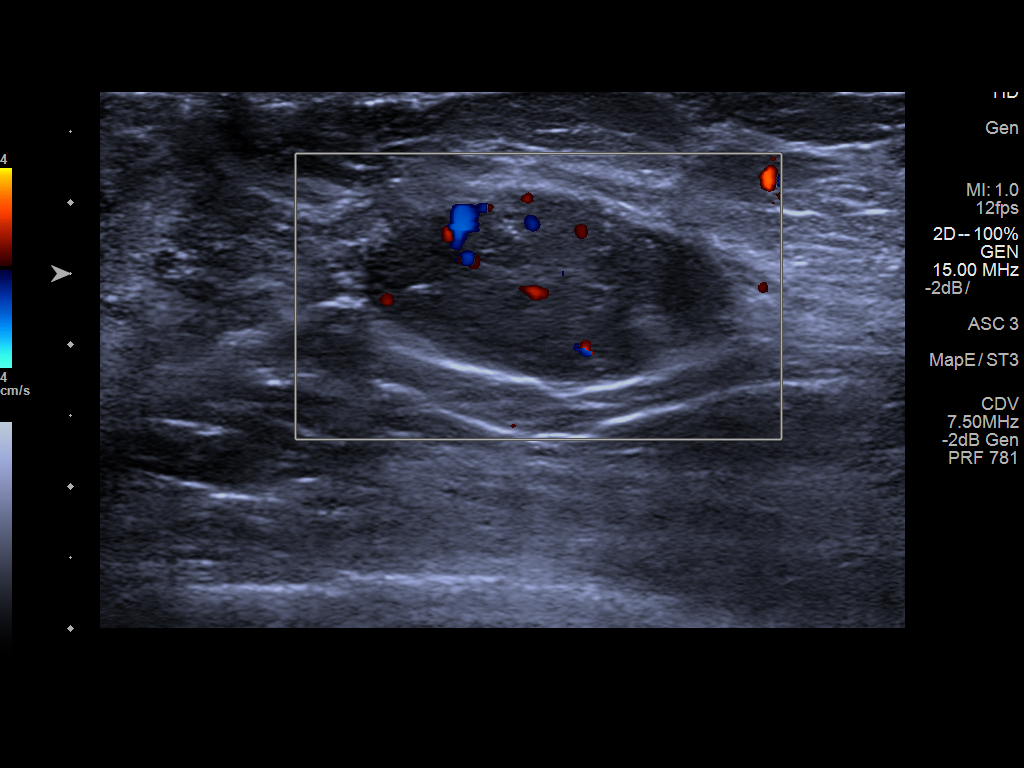
[im 2/3]
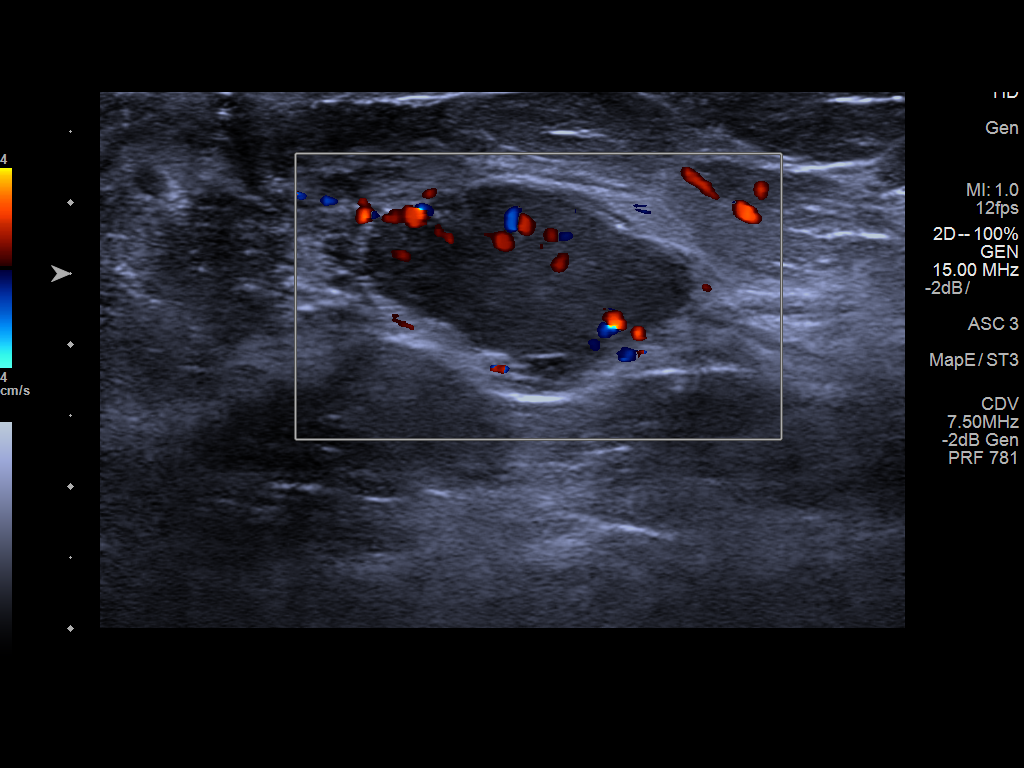
[im 3/3]
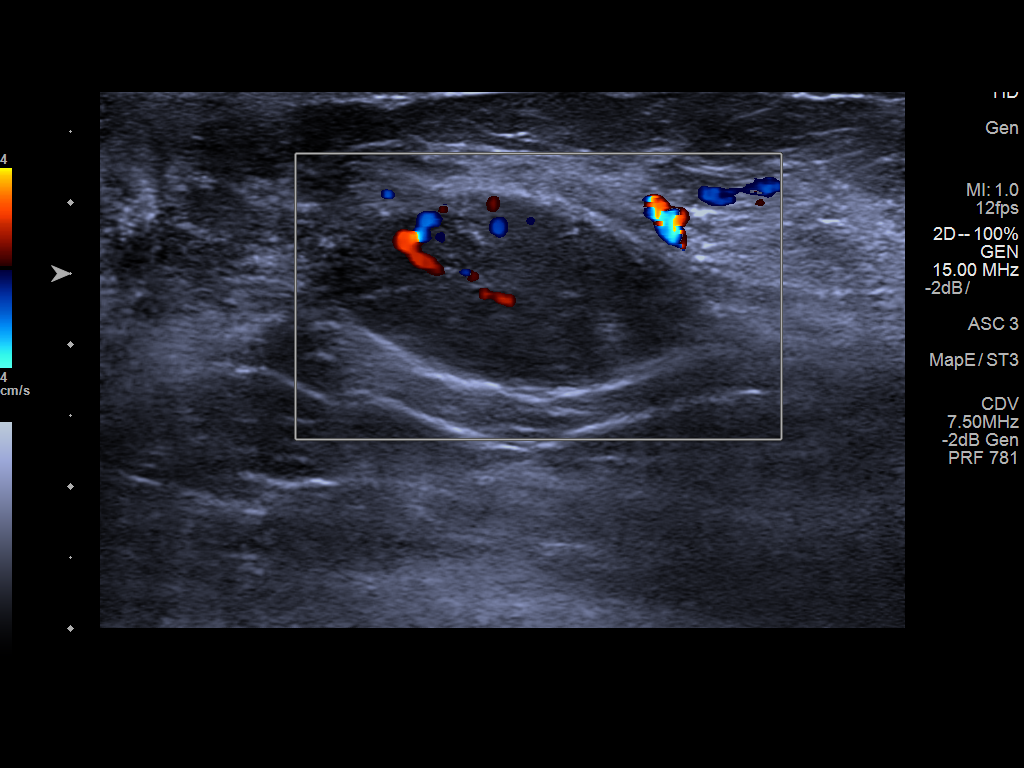

[3 of 3 positions shown; findings below may reference images not displayed]

FINDINGS: Targeted LEFT breast ultrasound is performed, showing the
biopsy-proven tubular adenoma at the 4 o'clock position
approximately 2 cm from the nipple at MIDDLE to POSTERIOR depth
which measures approximately 1.1 x 3.4 x 2.6 cm. In August 2016,
the mass measured approximately 1.2 x 3.4 x 2.8 cm. In August 2015, the mass measured approximately 1.2 x 3.3 x 3.0 cm.
IMPRESSION: Biopsy-proven tubular adenoma involving the LOWER OUTER QUADRANT of
the LEFT breast which is unchanged in size dating back to August 2015.

RECOMMENDATION:
No further imaging follow-up is felt necessary unless there are
persistent or intervening clinical concerns.

The patient did verbalize some concern about the mass despite its
stability. We discussed the option of surgical excision. She stated
that she will contact her primary physician should she decide that
she would like to have the mass excised.

I have discussed the findings and recommendations with the patient.
Results were also provided in writing at the conclusion of the
visit.

BI-RADS CATEGORY  2: Benign.

## 2019-05-03 ENCOUNTER — Encounter: Payer: Self-pay | Admitting: General Surgery

## 2019-05-03 ENCOUNTER — Other Ambulatory Visit: Payer: Self-pay

## 2019-05-03 ENCOUNTER — Ambulatory Visit (INDEPENDENT_AMBULATORY_CARE_PROVIDER_SITE_OTHER): Payer: Medicaid Other | Admitting: General Surgery

## 2019-05-03 VITALS — BP 115/78 | Temp 97.7°F | Resp 16 | Ht 65.0 in | Wt 179.0 lb

## 2019-05-03 DIAGNOSIS — D242 Benign neoplasm of left breast: Secondary | ICD-10-CM

## 2019-05-03 NOTE — Patient Instructions (Signed)
Breast Biopsy  A breast biopsy is a procedure in which a sample of breast tissue is removed from the breast and examined under a microscope to see if cancerous cells are present. You may need a breast biopsy if you have:   Any undiagnosed breast mass (tumor).   Nipple abnormalities, dimpling, crusting, or ulcerations.   Abnormal discharge from the nipple, especially blood.   Redness, swelling, and pain of the breast.   Calcium deposits (calcifications) or abnormalities seen on a mammogram, ultrasound results, or MRI results.   Abnormal changes in the breast seen on your mammogram.  If the breast abnormality is found to be cancerous (malignant), a breast biopsy can help to determine what the best treatment is for you. There are many different types of breast biopsies. Talk with your health care provider about your options and which type is best for you.  Tell a health care provider about:   Any allergies you have.   All medicines you are taking, including vitamins, herbs, eye drops, creams, and over-the-counter medicines.   Any problems you or family members have had with anesthetic medicines.   Any blood disorders you have.   Any surgeries you have had.   Any medical conditions you have.   Whether you are pregnant or may be pregnant.  What are the risks?  Generally, this is a safe procedure. However, problems may occur, including:   Discomfort. This is temporary.   Bruising and swelling of the breast.   Changes in the shape of the breast.   Bleeding.   Infection.   Damage to other tissues.   Allergic reactions to medicines.   Needing more surgery.  What happens before the procedure?  Medicines  Ask your health care provider about:   Changing or stopping your regular medicines. This is especially important if you are taking diabetes medicines or blood thinners.   Taking medicines such as aspirin and ibuprofen. These medicines can thin your blood. Do not take these medicines unless  your health care provider tells you to take them.   Taking over-the-counter medicines, vitamins, herbs, and supplements.  Lifestyle   Do not use any products that contain nicotine or tobacco, such as cigarettes, e-cigarettes, and chewing tobacco. If you need help quitting, ask your health care provider.   Do not drink alcohol for 24 hours before the procedure.   Wear a good support bra to the procedure.  Eating and drinking restrictions  Talk to your health care provider about when you should stop eating and drinking.   You may be asked not to drink or eat for 2-8 hours before the breast biopsy.   In some cases, you may be allowed to eat a light breakfast.  General instructions   Plan to have someone take you home from the hospital or clinic.   Ask your health care provider how your surgical site will be marked or identified.   Ask your health care provider what steps will be taken to help prevent infection. These may include:  ? Removing hair at the surgery site.  ? Washing skin with a germ-killing soap.   Your health care provider may do a procedure to locate and mark the tumor area in your breast (localization). This will help guide your surgeon to where the biopsy or incision is made. This may be done with:  ? Imaging, such as a mammogram, ultrasound, or MRI.  ? Insertion of special wire, clip, seed, or radar reflector implant in the   tumor area.  What happens during the procedure?     You may be given one or more of the following:  ? A medicine to numb the breast area (local anesthetic).  ? A medicine to help you relax (sedative).  ? A medicine to make you fall asleep (general anesthetic).   Your health care provider will perform the biopsy using only one of the following methods. He or she will do:  ? Fine needle aspiration. A thin needle with a syringe will be inserted into a breast cyst. Fluid and cells will be removed.  ? Core needle biopsy. A wide, hollow needle (core needle) will be inserted  into a breast lump multiple times to remove tissue samples or cores.  ? Stereotactic biopsy. X-rays and a computer will be used to locate the breast lump. The surgeon will use the X-ray images to collect several samples of tissue using a needle.  ? Vacuum-assisted biopsy. A small incision will be made in your breast. A hollow needle and vacuum will be passed through the incision and into the breast tissue. The vacuum will gently draw abnormal breast tissue into the needle to remove it.  ? Ultrasound-guided core needle biopsy. An ultrasound will be used to help guide the core needle to the area of the mass or abnormality. An incision will be made to insert the needle. Then tissue samples will be removed.  ? Surgical biopsy. An incision will be made in the breast to remove part or all of the abnormal tissue. After the tissue is removed, the skin over the area will be closed with sutures and covered with a dressing. There are two types of surgical biopsies:   Incisional biopsy. The surgeon will remove part of the breast lump.   Excisional biopsy. The surgeon will attempt to remove the whole breast lump or as much of it as possible.  After any of these procedures, the tissue or fluid that was removed will be examined under a microscope.  The procedure may vary among health care providers and hospitals.  What happens after the procedure?   You will be taken to the recovery area. If you are doing well and have no problems, you will be allowed to go home.   You may have a pressure dressing applied on your breast for 24-48 hours. You may also be advised to wear a supportive bra during this time.   Do not drive for 24 hours if you were given a sedative during your procedure.  Summary   A breast biopsy is a procedure in which a sample of breast tissue is removed from the breast and examined under a microscope to see if cancerous cells are present.   This is a safe procedure, but problems can occur, including bleeding,  infection, pain, and bruising.   Ask your health care provider about changing or stopping your regular medicines.   Plan to have someone take you home from the hospital or clinic.  This information is not intended to replace advice given to you by your health care provider. Make sure you discuss any questions you have with your health care provider.  Document Released: 11/15/2005 Document Revised: 05/03/2018 Document Reviewed: 05/03/2018  Elsevier Interactive Patient Education  2019 [employer]

## 2019-05-03 NOTE — Progress Notes (Signed)
[facility] History and Physical  Reason for Referral: Left breast mass  Referring Physician:  Derrek Monaco PA   Chief Complaint    Pre-op Exam      Stephanie Hodges is a 23 y.o. female.  HPI: Stephanie Hodges is a 19 yo well known to me who had her surgery post poned for COVID back in March 2020. She had come to see me after noticing a breast mass and having a biopsy that revealed a tubular adenoma. She continued to have pain associated with the mass and also had concerns due to being adopted.  She said that the mass hurt randomly and was not associated with her cycle. She has tried to cut back on caffeine with some minor improvement.  She returns to get her surgery rescheduled.    Past Medical History:  Diagnosis Date  . 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 Contraceptive education 10/03/2014  . Headache(784.0)    migraines  . History of chlamydia   . Nexplanon insertion 10/16/2014   Inserted left arm 10/16/14 remove 10/16/17  . No pertinent past medical history   . Pregnant   . Seasonal allergies     Past Surgical History:  Procedure Laterality Date  . NO PAST SURGERIES      Family History  Problem Relation Age of Onset  . Hypertension Mother   . Stroke Mother     Social History   Tobacco Use  . Smoking status: Former Smoker    Packs/day: 0.22    Types: Cigarettes  . Smokeless tobacco: Never Used  Substance Use Topics  . Alcohol use: No  . Drug use: No    Medications: I have reviewed the patient's current medications. Allergies as of 05/03/2019      Reactions   Coconut Flavor [flavoring Agent] Swelling, Rash   Mushroom Extract Complex Rash      Medication List       Accurate as of May 03, 2019  3:41 PM. If you have any questions, ask your nurse or doctor.        escitalopram 20 MG tablet  Commonly known as:  Lexapro Take 1 tablet (20 mg total) by mouth daily.   ibuprofen 800 MG tablet Commonly known as:  ADVIL Take 800 mg by mouth every 8 (eight) hours as needed for mild pain.   naproxen 375 MG tablet Commonly known as:  NAPROSYN Take 1 every 8 hours prn cramps What changed:    how much to take  how to take this  when to take this  additional instructions   Norethin Ace-Eth Estrad-FE 1-20 MG-MCG(24) Caps Commonly known as:  Taytulla Take 1 tablet by mouth daily.   Tylenol 325 MG tablet Generic drug:  acetaminophen Take 650 mg by mouth every 6 (six) hours as needed (pain).        ROS:  A comprehensive review of systems was negative except for: Integument/breast: positive for breast lump and tenderness/ pain at the mass  Blood pressure 115/78, temperature 97.7 F (36.5 C), temperature source Temporal, resp. rate 16, height 5\' 5"  (1.651 m), weight 179 lb (81.2 kg). Physical Exam Vitals signs reviewed.  Constitutional:      Appearance: Normal appearance.  HENT:     Head: Normocephalic and atraumatic.     Nose: Nose normal.     Mouth/Throat:  Mouth: Mucous membranes are moist.  Eyes:     Extraocular Movements: Extraocular movements intact.     Pupils: Pupils are equal, round, and reactive to light.  Neck:     Musculoskeletal: Normal range of motion.  Cardiovascular:     Rate and Rhythm: Normal rate and regular rhythm.  Pulmonary:     Effort: Pulmonary effort is normal.     Breath sounds: Normal breath sounds.  Chest:     Breasts:        Left: Tenderness present. No inverted nipple, nipple discharge or skin change.     Comments: Left breast without obvious mass, no nipple drainage or discharge, unable to appreciate mass  Abdominal:     General: There is no distension.     Palpations: Abdomen is soft.     Tenderness: There is no abdominal tenderness.  Musculoskeletal: Normal range of motion.        General: No swelling.  Skin:     General: Skin is warm and dry.  Neurological:     General: No focal deficit present.     Mental Status: She is alert and oriented to person, place, and time.  Psychiatric:        Mood and Affect: Mood normal.        Behavior: Behavior normal.        Thought Content: Thought content normal.        Judgment: Judgment normal.     Results: [place] Left breast 12/2018 FINDINGS: Targeted LEFT breast ultrasound is performed, showing the biopsy-proven tubular adenoma at the 4 o'clock position approximately 2 cm from the nipple at MIDDLE to POSTERIOR depth which measures approximately 1.1 x 3.4 x 2.6 cm. In October, 2017, the mass measured approximately 1.2 x 3.4 x 2.8 cm. In October, 2016, the mass measured approximately 1.2 x 3.3 x 3.0 cm.  IMPRESSION: Biopsy-proven tubular adenoma involving the LOWER OUTER QUADRANT of the LEFT breast which is unchanged in size dating back to October, 2016.  RECOMMENDATION: No further imaging follow-up is felt necessary unless there are persistent or intervening clinical concerns.  The patient did verbalize some concern about the mass despite its stability. We discussed the option of surgical excision. She stated that she will contact her primary physician should she decide that she would like to have the mass excised.  I have discussed the findings and recommendations with the patient. Results were also provided in writing at the conclusion of the visit.  BI-RADS CATEGORY 2: Benign.  Pathology 08/2016 Diagnosis Breast, left, needle core biopsy, 4:00 - TUBULAR ADENOMA. - NO EVIDENCE OF MALIGNANCY. - SEE MICROSCOPIC DESCRIPTION. Microscopic Comment Called to [facility] on 09/30/16. (JDP:ds 09/30/16)   Assessment & Plan:  Stephanie Hodges is a 23 y.o. female with a tubular adenoma of the left breast. We discussed this time and prior the option of monitoring and the benign nature of this mass. We have discussed that due  to her pain we could remove the area.  She wants to proceed as the measures I recommended have helped minimally.    -Excisional biopsy after needle localization (unable to palpate discrete mass)  All questions were answered to the satisfaction of the patient.  The risk and benefits of excisional biopsy after needle localization were discussed including but not limited to bleeding, infection, cosmetic changes, risk of recurrence, risk of finding something that is more concerning, need for further surgery.  After careful consideration, Nani Ingram has decided to  proceed.    Virl Cagey 05/03/2019, 3:41 PM

## 2019-05-08 NOTE — H&P (Signed)
[facility] History and Physical  Reason for Referral: Left breast mass  Referring Physician:  Derrek Monaco PA      Chief Complaint    Pre-op Exam      Stephanie Hodges is a 23 y.o. female.  HPI: Stephanie Hodges is a 18 yo well known to me who had her surgery post poned for COVID back in March 2020. She had come to see me after noticing a breast mass and having a biopsy that revealed a tubular adenoma. She continued to have pain associated with the mass and also had concerns due to being adopted.  She said that the mass hurt randomly and was not associated with her cycle. She has tried to cut back on caffeine with some minor improvement.  She returns to get her surgery rescheduled.        Past Medical History:  Diagnosis Date  . 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 Contraceptive education 10/03/2014  . Headache(784.0)    migraines  . History of chlamydia   . Nexplanon insertion 10/16/2014   Inserted left arm 10/16/14 remove 10/16/17  . No pertinent past medical history   . Pregnant   . Seasonal allergies          Past Surgical History:  Procedure Laterality Date  . NO PAST SURGERIES           Family History  Problem Relation Age of Onset  . Hypertension Mother   . Stroke Mother     Social History        Tobacco Use  . Smoking status: Former Smoker    Packs/day: 0.22    Types: Cigarettes  . Smokeless tobacco: Never Used  Substance Use Topics  . Alcohol use: No  . Drug use: No    Medications: I have reviewed the patient's current medications.      Allergies as of 05/03/2019      Reactions   Coconut Flavor [flavoring Agent] Swelling, Rash   Mushroom Extract Complex Rash         Medication List       Accurate as of May 03, 2019  3:41 PM. If you have any  questions, ask your nurse or doctor.        escitalopram 20 MG tablet Commonly known as:  Lexapro Take 1 tablet (20 mg total) by mouth daily.   ibuprofen 800 MG tablet Commonly known as:  ADVIL Take 800 mg by mouth every 8 (eight) hours as needed for mild pain.   naproxen 375 MG tablet Commonly known as:  NAPROSYN Take 1 every 8 hours prn cramps What changed:    how much to take  how to take this  when to take this  additional instructions   Norethin Ace-Eth Estrad-FE 1-20 MG-MCG(24) Caps Commonly known as:  Taytulla Take 1 tablet by mouth daily.   Tylenol 325 MG tablet Generic drug:  acetaminophen Take 650 mg by mouth every 6 (six) hours as needed (pain).        ROS:  A comprehensive review of systems was negative except for: Integument/breast: positive for breast lump and tenderness/ pain at the mass  Blood pressure 115/78, temperature 97.7 F (36.5 C), temperature source Temporal, resp. rate 16, height 5\' 5"  (1.651 m), weight 179 lb (81.2 kg). Physical Exam Vitals signs reviewed.  Constitutional:  Appearance: Normal appearance.  HENT:     Head: Normocephalic and atraumatic.     Nose: Nose normal.     Mouth/Throat:     Mouth: Mucous membranes are moist.  Eyes:     Extraocular Movements: Extraocular movements intact.     Pupils: Pupils are equal, round, and reactive to light.  Neck:     Musculoskeletal: Normal range of motion.  Cardiovascular:     Rate and Rhythm: Normal rate and regular rhythm.  Pulmonary:     Effort: Pulmonary effort is normal.     Breath sounds: Normal breath sounds.  Chest:     Breasts:        Left: Tenderness present. No inverted nipple, nipple discharge or skin change.     Comments: Left breast without obvious mass, no nipple drainage or discharge, unable to appreciate mass  Abdominal:     General: There is no distension.     Palpations: Abdomen is soft.     Tenderness: There is no abdominal tenderness.   Musculoskeletal: Normal range of motion.        General: No swelling.  Skin:    General: Skin is warm and dry.  Neurological:     General: No focal deficit present.     Mental Status: She is alert and oriented to person, place, and time.  Psychiatric:        Mood and Affect: Mood normal.        Behavior: Behavior normal.        Thought Content: Thought content normal.        Judgment: Judgment normal.     Results: [place] Left breast 12/2018 FINDINGS: Targeted LEFT breast ultrasound is performed, showing the biopsy-proven tubular adenoma at the 4 o'clock position approximately 2 cm from the nipple at MIDDLE to POSTERIOR depth which measures approximately 1.1 x 3.4 x 2.6 cm. In October, 2017, the mass measured approximately 1.2 x 3.4 x 2.8 cm. In October, 2016, the mass measured approximately 1.2 x 3.3 x 3.0 cm.  IMPRESSION: Biopsy-proven tubular adenoma involving the LOWER OUTER QUADRANT of the LEFT breast which is unchanged in size dating back to October, 2016.  RECOMMENDATION: No further imaging follow-up is felt necessary unless there are persistent or intervening clinical concerns.  The patient did verbalize some concern about the mass despite its stability. We discussed the option of surgical excision. She stated that she will contact her primary physician should she decide that she would like to have the mass excised.  I have discussed the findings and recommendations with the patient. Results were also provided in writing at the conclusion of the visit.  BI-RADS CATEGORY 2: Benign.  Pathology 08/2016 Diagnosis Breast, left, needle core biopsy, 4:00 - TUBULAR ADENOMA. - NO EVIDENCE OF MALIGNANCY. - SEE MICROSCOPIC DESCRIPTION. Microscopic Comment Called to [facility] on 09/30/16. (JDP:ds 09/30/16)   Assessment & Plan:  Stephanie Hodges is a 23 y.o. female with a tubular adenoma of the left breast. We discussed this time and prior  the option of monitoring and the benign nature of this mass. We have discussed that due to her pain we could remove the area.  She wants to proceed as the measures I recommended have helped minimally.    -Excisional biopsy after needle localization (unable to palpate discrete mass)  All questions were answered to the satisfaction of the patient.  The risk and benefits of excisional biopsy after needle localization were discussed including but not limited  to bleeding, infection, cosmetic changes, risk of recurrence, risk of finding something that is more concerning, need for further surgery.  After careful consideration, Stephanie Hodges has decided to proceed.    Virl Cagey 05/03/2019, 3:41 PM

## 2019-05-14 ENCOUNTER — Telehealth: Payer: Self-pay

## 2019-05-14 NOTE — Patient Instructions (Signed)
Stephanie Hodges  05/14/2019     @PREFPERIOPPHARMACY @   Your procedure is scheduled on  05/23/2019 .  Report to [facility] at  [email or web address].  Call this number if you have problems the morning of surgery:  [phone]   Remember:  Do not eat or drink after midnight.                        Take these medicines the morning of surgery with A SIP OF WATER  lexapro.    Do not wear jewelry, make-up or nail polish.  Do not wear lotions, powders, or perfumes, or deodorant.  Do not shave 48 hours prior to surgery.  Men may shave face and neck.  Do not bring valuables to the hospital.  [facility] is not responsible for any belongings or valuables.  Contacts, dentures or bridgework may not be worn into surgery.  Leave your suitcase in the car.  After surgery it may be brought to your room.  For patients admitted to the hospital, discharge time will be determined by your treatment team.  Patients discharged the day of surgery will not be allowed to drive home.   Name and phone number of your driver:   family Special instructions:  None  Please read over the following fact sheets that you were given. Anesthesia Post-op Instructions and Care and Recovery After Surgery       Breast Biopsy, Care After These instructions give you information about caring for yourself after your procedure. Your doctor may also give you more specific instructions. Call your doctor if you have any problems or questions after your procedure. What can I expect after the procedure? After your procedure, it is common to have:  Bruising on your breast.  Numbness, tingling, or pain near your biopsy site. Follow these instructions at home: Medicines  Take over-the-counter and prescription medicines only as told by your doctor.  Do not drive for 24 hours if you were given a medicine to help you relax (sedative) during your procedure.  Do not drink alcohol while taking pain medicine.  Do not drive  or use heavy machinery while taking prescription pain medicine. Biopsy site care      Follow instructions from your doctor about how to take care of your cut from surgery (incision) or your puncture area. Make sure you: ? Wash your hands with soap and water before you change your bandage (dressing). If you cannot use soap and water, use hand sanitizer. ? Change your bandage as told by your doctor. ? Leave stitches (sutures), skin glue, or skin tape (adhesive strips) in place. They may need to stay in place for 2 weeks or longer. If tape strips get loose and curl up, you may trim the loose edges. Do not remove tape strips completely unless your doctor says it is okay.  If you have stitches, keep them dry when you take a bath or a shower.  Check your cut or puncture area every day for signs of infection. Check for: ? Redness, swelling, or pain. ? Fluid or blood. ? Warmth. ? Pus or a bad smell.  Protect the biopsy area. Do not let the area get bumped. Activity  If you had a cut during your procedure, avoid activities that could pull your cut open. These include: ? Stretching. ? Reaching over your head. ? Exercise. ? Sports. ? Lifting anything that weighs more than  3 lb (1.4 kg).  Return to your normal activities as told by your doctor. Ask your doctor what activities are safe for you. Managing pain, stiffness, and swelling If told, put ice on the biopsy site to relieve swelling:  Put ice in a plastic bag.  Place a towel between your skin and the bag.  Leave the ice on for 20 minutes, 2-3 times a day. General instructions  Continue your normal diet.  Wear a good support bra for as long as told by your doctor.  Get checked for extra fluid around your lymph nodes (lymphedema) as often as told by your doctor.  Keep all follow-up visits as told by your doctor. This is important. Contact a doctor if:  You notice any of the following at the biopsy site: ? More redness,  swelling, or pain. ? More fluid or blood coming from the site. ? The site feels warm to the touch. ? Pus or a bad smell coming from the site. ? The site breaks open after the stitches or skin tape strips have been removed.  You have a rash.  You have a fever. Get help right away if:  You have more bleeding from the biopsy site. Get help right away if bleeding is more than a small spot.  You have trouble breathing.  You have red streaks around the biopsy site. Summary  After your procedure, it is common to have bruising, numbness, tingling, or pain near the biopsy site.  Do not drive or use heavy machinery while taking prescription pain medicine.  Wear a good support bra for as long as told by your doctor.  If you had a cut during your procedure, avoid activities that may pull the cut open. Ask your doctor what activities are safe for you. This information is not intended to replace advice given to you by your health care provider. Make sure you discuss any questions you have with your health care provider. Document Released: 09/11/2009 Document Revised: 05/04/2018 Document Reviewed: 05/04/2018 Elsevier Interactive Patient Education  2019 [place], Care After These instructions provide you with information about caring for yourself after your procedure. Your health care provider may also give you more specific instructions. Your treatment has been planned according to current medical practices, but problems sometimes occur. Call your health care provider if you have any problems or questions after your procedure. What can I expect after the procedure? After your procedure, you may:  Feel sleepy for several hours.  Feel clumsy and have poor balance for several hours.  Feel forgetful about what happened after the procedure.  Have poor judgment for several hours.  Feel nauseous or vomit.  Have a sore throat if you had a breathing tube during  the procedure. Follow these instructions at home: For at least 24 hours after the procedure:      Have a responsible adult stay with you. It is important to have someone help care for you until you are awake and alert.  Rest as needed.  Do not: ? Participate in activities in which you could fall or become injured. ? Drive. ? Use heavy machinery. ? Drink alcohol. ? Take sleeping pills or medicines that cause drowsiness. ? Make important decisions or sign legal documents. ? Take care of children on your own. Eating and drinking  Follow the diet that is recommended by your health care provider.  If you vomit, drink water, juice, or soup when you can drink without vomiting.  Make sure you have little or no nausea before eating solid foods. General instructions  Take over-the-counter and prescription medicines only as told by your health care provider.  If you have sleep apnea, surgery and certain medicines can increase your risk for breathing problems. Follow instructions from your health care provider about wearing your sleep device: ? Anytime you are sleeping, including during daytime naps. ? While taking prescription pain medicines, sleeping medicines, or medicines that make you drowsy.  If you smoke, do not smoke without supervision.  Keep all follow-up visits as told by your health care provider. This is important. Contact a health care provider if:  You keep feeling nauseous or you keep vomiting.  You feel light-headed.  You develop a rash.  You have a fever. Get help right away if:  You have trouble breathing. Summary  For several hours after your procedure, you may feel sleepy and have poor judgment.  Have a responsible adult stay with you for at least 24 hours or until you are awake and alert. This information is not intended to replace advice given to you by your health care provider. Make sure you discuss any questions you have with your health care  provider. Document Released: 03/07/2016 Document Revised: 07/01/2017 Document Reviewed: 03/07/2016 Elsevier Interactive Patient Education  2019 [place]. How to Use Chlorhexidine Before Surgery Chlorhexidine gluconate (CHG) is a germ-killing (antiseptic) solution that is used to clean the skin. It gets rid of the bacteria that normally live on the skin. Cleaning your skin with CHG before surgery helps lower the risk for infection after surgery. To clean your skin before surgery, you may be given:  A CHG solution to use in the shower.  A prepackaged cloth that contains CHG. What are the risks? Risks of using CHG include:  A skin reaction.  Hearing loss, if CHG gets in your ears.  Eye injury, if CHG gets in your eyes and is not rinsed out.  The CHG product catching fire. Make sure that you avoid smoking and flames after applying CHG to your skin. Do not use CHG:  If you have a chlorhexidine allergy or have previously reacted to chlorhexidine.  On babies younger than 51 months of age. How to use CHG solution   Use CHG only as told by your health care provider, and follow the instructions on the label.  Use CHG solution while taking a shower. Follow these steps when using CHG solution (unless your health care provider gives you different instructions): 1. Start the shower. 2. Use your normal soap and shampoo to wash your face and hair. 3. Turn off the shower or move out of the shower stream. 4. Pour the CHG onto a clean washcloth. Do not use any type of brush or rough-edged sponge. 5. Starting at your neck, lather your body down to your toes. Make sure you:  Pay special attention to the part of your body where you will be having surgery. Scrub this area for at least 1 minute.  Use the full amount of CHG as directed. Usually, this is one bottle.  Do not use CHG on your head or face. If the solution gets into your ears or eyes, rinse them well with water.  Avoid your genital  area.  Avoid any areas of skin that have broken skin, cuts, or scrapes.  Scrub your back and under your arms. Make sure to wash skin folds. 6. Let the lather sit on your skin for 1-2 minutes or as  long as told by your health care provider. 7. Thoroughly rinse your entire body in the shower. Make sure that all body creases and crevices are rinsed well. 8. Dry off with a clean towel. Do not put any substances on your body afterward, such as powder, lotion, or perfume. 9. Put on clean clothes or pajamas. 10. If it is the night before your surgery, sleep in clean sheets. How to use CHG prepackaged cloths   Only use CHG cloths as told by your health care provider, and follow the instructions on the label.  Use the CHG cloth on clean, dry skin. Follow these steps when using a CHG cloth (unless your health care provider gives you different instructions): 1. Using the CHG cloth, vigorously scrub the part of your body where you will be having surgery. Scrub using a back-and-forth motion for 3 minutes. The area on your body should be completely wet with CHG when you are done scrubbing. 2. Do not rinse. Discard the cloth and let the area air-dry for 1 minute. Do not put any substances on your body afterward, such as powder, lotion, or perfume. 3. Put on clean clothes or pajamas. 4. If it is the night before your surgery, sleep in clean sheets. Contact a health care provider if:  Your skin gets irritated after scrubbing.  You have questions about using your solution or cloth. Get help right away if:  Your eyes become very red or swollen.  Your eyes itch badly.  Your skin itches badly and is red or swollen.  Your hearing changes.  You have trouble seeing.  You have swelling or tingling in your mouth or throat.  You have trouble breathing.  You swallow any chlorhexidine. Summary  Chlorhexidine gluconate (CHG) is a germ-killing (antiseptic) solution that is used to clean the skin.  Cleaning your skin with CHG before surgery helps lower the risk for infection after surgery.  You may be given CHG to use at home. It may be in a bottle or in a prepackaged cloth to use on your skin. Carefully follow your health care provider's instructions and the instructions on the product label.  Do not use CHG if you have a chlorhexidine allergy.  Contact your health care provider if your skin gets irritated after scrubbing. This information is not intended to replace advice given to you by your health care provider. Make sure you discuss any questions you have with your health care provider. Document Released: 08/09/2012 Document Revised: 10/13/2017 Document Reviewed: 10/13/2017 Elsevier Interactive Patient Education  2019 [employer].

## 2019-05-14 NOTE — Telephone Encounter (Signed)
Patient left voice mail on office phone regarding a note for work stating when she would be out of work due to surgery. I will use the covid test date of 05/18/2019, when should the end date be?

## 2019-05-17 ENCOUNTER — Other Ambulatory Visit (HOSPITAL_COMMUNITY): Payer: Self-pay | Admitting: General Surgery

## 2019-05-17 DIAGNOSIS — IMO0002 Reserved for concepts with insufficient information to code with codable children: Secondary | ICD-10-CM

## 2019-05-18 ENCOUNTER — Other Ambulatory Visit: Payer: Self-pay

## 2019-05-18 ENCOUNTER — Encounter (HOSPITAL_COMMUNITY)
Admission: RE | Admit: 2019-05-18 | Discharge: 2019-05-18 | Disposition: A | Discharge status: Home / Self Care | Payer: Medicaid Other | Source: Ambulatory Visit | Attending: General Surgery | Admitting: General Surgery

## 2019-05-18 ENCOUNTER — Other Ambulatory Visit (HOSPITAL_COMMUNITY)
Admission: RE | Admit: 2019-05-18 | Discharge: 2019-05-18 | Disposition: A | Discharge status: Home / Self Care | Payer: Medicaid Other | Source: Ambulatory Visit | Attending: General Surgery | Admitting: General Surgery

## 2019-05-18 ENCOUNTER — Encounter (HOSPITAL_COMMUNITY): Payer: Self-pay

## 2019-05-18 DIAGNOSIS — Z01812 Encounter for preprocedural laboratory examination: Secondary | ICD-10-CM | POA: Diagnosis not present

## 2019-05-18 DIAGNOSIS — Z1159 Encounter for screening for other viral diseases: Secondary | ICD-10-CM | POA: Diagnosis not present

## 2019-05-18 LAB — HCG, SERUM, QUALITATIVE: Preg, Serum: NEGATIVE

## 2019-05-20 LAB — NOVEL CORONAVIRUS, NAA (HOSP ORDER, SEND-OUT TO REF LAB; TAT 18-24 HRS): SARS-CoV-2, NAA: NOT DETECTED

## 2019-05-23 ENCOUNTER — Encounter (HOSPITAL_COMMUNITY)
Admission: RE | Disposition: A | Discharge status: Home / Self Care | Payer: Self-pay | Source: Home / Self Care | Attending: General Surgery

## 2019-05-23 ENCOUNTER — Ambulatory Visit (HOSPITAL_COMMUNITY): Payer: Medicaid Other | Admitting: Anesthesiology

## 2019-05-23 ENCOUNTER — Ambulatory Visit (HOSPITAL_COMMUNITY): Payer: Medicaid Other

## 2019-05-23 ENCOUNTER — Other Ambulatory Visit (HOSPITAL_COMMUNITY): Payer: Self-pay | Admitting: General Surgery

## 2019-05-23 ENCOUNTER — Ambulatory Visit (HOSPITAL_COMMUNITY)
Admission: RE | Admit: 2019-05-23 | Discharge: 2019-05-23 | Disposition: A | Discharge status: Home / Self Care | Payer: Medicaid Other | Source: Ambulatory Visit | Attending: General Surgery | Admitting: General Surgery

## 2019-05-23 ENCOUNTER — Ambulatory Visit (HOSPITAL_COMMUNITY)
Admission: RE | Admit: 2019-05-23 | Discharge: 2019-05-23 | Disposition: A | Discharge status: Home / Self Care | Payer: Medicaid Other | Attending: General Surgery | Admitting: General Surgery

## 2019-05-23 ENCOUNTER — Encounter (HOSPITAL_COMMUNITY): Payer: Self-pay | Admitting: Anesthesiology

## 2019-05-23 ENCOUNTER — Other Ambulatory Visit: Payer: Self-pay

## 2019-05-23 DIAGNOSIS — R928 Other abnormal and inconclusive findings on diagnostic imaging of breast: Secondary | ICD-10-CM

## 2019-05-23 DIAGNOSIS — Z793 Long term (current) use of hormonal contraceptives: Secondary | ICD-10-CM | POA: Diagnosis not present

## 2019-05-23 DIAGNOSIS — IMO0002 Reserved for concepts with insufficient information to code with codable children: Secondary | ICD-10-CM

## 2019-05-23 DIAGNOSIS — Z87891 Personal history of nicotine dependence: Secondary | ICD-10-CM | POA: Insufficient documentation

## 2019-05-23 DIAGNOSIS — F419 Anxiety disorder, unspecified: Secondary | ICD-10-CM | POA: Diagnosis not present

## 2019-05-23 DIAGNOSIS — D242 Benign neoplasm of left breast: Secondary | ICD-10-CM | POA: Diagnosis present

## 2019-05-23 DIAGNOSIS — F329 Major depressive disorder, single episode, unspecified: Secondary | ICD-10-CM | POA: Insufficient documentation

## 2019-05-23 HISTORY — PX: BREAST BIOPSY: SHX20

## 2019-05-23 SURGERY — BREAST BIOPSY WITH NEEDLE LOCALIZATION
Anesthesia: General | Site: Breast | Laterality: Left

## 2019-05-23 MED ORDER — PROPOFOL 10 MG/ML IV BOLUS
INTRAVENOUS | Status: DC | PRN
  Administered 2019-05-23: 170 mg via INTRAVENOUS
  Administered 2019-05-23 (×2): 30 mg via INTRAVENOUS

## 2019-05-23 MED ORDER — CEFAZOLIN SODIUM-DEXTROSE 2-4 GM/100ML-% IV SOLN: 2.0000 g | INTRAVENOUS | Status: DC

## 2019-05-23 MED ORDER — DOCUSATE SODIUM 100 MG PO CAPS: 100.0000 mg | ORAL_CAPSULE | Freq: Two times a day (BID) | ORAL | 2 refills | Status: AC

## 2019-05-23 MED ORDER — HYDROCODONE-ACETAMINOPHEN 7.5-325 MG PO TABS: 1.0000 | ORAL_TABLET | Freq: Once | ORAL | Status: DC | PRN

## 2019-05-23 MED ORDER — PROPOFOL 10 MG/ML IV BOLUS
INTRAVENOUS | Status: AC
  Filled 2019-05-23: qty 20

## 2019-05-23 MED ORDER — LACTATED RINGERS IV SOLN
INTRAVENOUS | Status: DC
  Administered 2019-05-23: 1000 mL via INTRAVENOUS

## 2019-05-23 MED ORDER — MIDAZOLAM HCL 5 MG/5ML IJ SOLN
INTRAMUSCULAR | Status: DC | PRN
  Administered 2019-05-23: 2 mg via INTRAVENOUS

## 2019-05-23 MED ORDER — PROMETHAZINE HCL 25 MG/ML IJ SOLN: 6.2500 mg | INTRAMUSCULAR | Status: DC | PRN

## 2019-05-23 MED ORDER — 0.9 % SODIUM CHLORIDE (POUR BTL) OPTIME
TOPICAL | Status: DC | PRN
  Administered 2019-05-23: 1000 mL

## 2019-05-23 MED ORDER — LIDOCAINE 2% (20 MG/ML) 5 ML SYRINGE
INTRAMUSCULAR | Status: DC | PRN
  Administered 2019-05-23: 40 mg via INTRAVENOUS

## 2019-05-23 MED ORDER — CHLORHEXIDINE GLUCONATE CLOTH 2 % EX PADS: 6.0000 | MEDICATED_PAD | Freq: Once | CUTANEOUS | Status: DC

## 2019-05-23 MED ORDER — HYDROMORPHONE HCL 1 MG/ML IJ SOLN
0.2500 mg | INTRAMUSCULAR | Status: DC | PRN
  Administered 2019-05-23: 0.5 mg via INTRAVENOUS
  Filled 2019-05-23: qty 0.5

## 2019-05-23 MED ORDER — MEPERIDINE HCL 50 MG/ML IJ SOLN: 6.2500 mg | INTRAMUSCULAR | Status: DC | PRN

## 2019-05-23 MED ORDER — GLYCOPYRROLATE PF 0.2 MG/ML IJ SOSY
PREFILLED_SYRINGE | INTRAMUSCULAR | Status: DC | PRN
  Administered 2019-05-23: .2 mg via INTRAVENOUS

## 2019-05-23 MED ORDER — LIDOCAINE HCL (PF) 2 % IJ SOLN
INTRAMUSCULAR | Status: AC
  Filled 2019-05-23: qty 10

## 2019-05-23 MED ORDER — FENTANYL CITRATE (PF) 100 MCG/2ML IJ SOLN
INTRAMUSCULAR | Status: AC
  Filled 2019-05-23: qty 2

## 2019-05-23 MED ORDER — BUPIVACAINE HCL (PF) 0.5 % IJ SOLN
INTRAMUSCULAR | Status: DC | PRN
  Administered 2019-05-23: 20 mL

## 2019-05-23 MED ORDER — MIDAZOLAM HCL 2 MG/2ML IJ SOLN
INTRAMUSCULAR | Status: AC
  Filled 2019-05-23: qty 2

## 2019-05-23 MED ORDER — CEFAZOLIN SODIUM-DEXTROSE 2-4 GM/100ML-% IV SOLN
INTRAVENOUS | Status: AC
  Filled 2019-05-23: qty 100

## 2019-05-23 MED ORDER — OXYCODONE HCL 5 MG PO TABS: 5.0000 mg | ORAL_TABLET | ORAL | 0 refills | Status: DC | PRN

## 2019-05-23 MED ORDER — LIDOCAINE 2% (20 MG/ML) 5 ML SYRINGE
INTRAMUSCULAR | Status: AC
  Filled 2019-05-23: qty 5

## 2019-05-23 MED ORDER — FENTANYL CITRATE (PF) 100 MCG/2ML IJ SOLN
INTRAMUSCULAR | Status: DC | PRN
  Administered 2019-05-23: 25 ug via INTRAVENOUS
  Administered 2019-05-23 (×2): 50 ug via INTRAVENOUS
  Administered 2019-05-23: 25 ug via INTRAVENOUS

## 2019-05-23 MED ORDER — LACTATED RINGERS IV SOLN: INTRAVENOUS | Status: DC

## 2019-05-23 MED ORDER — BUPIVACAINE HCL (PF) 0.5 % IJ SOLN
INTRAMUSCULAR | Status: AC
  Filled 2019-05-23: qty 30

## 2019-05-23 SURGICAL SUPPLY — 32 items
BLADE SURG 15 STRL LF DISP TIS (BLADE) ×1 IMPLANT
BLADE SURG 15 STRL SS (BLADE) ×2
CHLORAPREP W/TINT 26 (MISCELLANEOUS) ×3 IMPLANT
CLOTH BEACON ORANGE TIMEOUT ST (SAFETY) ×3 IMPLANT
COVER LIGHT HANDLE STERIS (MISCELLANEOUS) ×6 IMPLANT
COVER WAND RF STERILE (DRAPES) ×3 IMPLANT
DECANTER SPIKE VIAL GLASS SM (MISCELLANEOUS) ×3 IMPLANT
DERMABOND ADVANCED (GAUZE/BANDAGES/DRESSINGS) ×2
DERMABOND ADVANCED .7 DNX12 (GAUZE/BANDAGES/DRESSINGS) ×1 IMPLANT
ELECT REM PT RETURN 9FT ADLT (ELECTROSURGICAL) ×3
ELECTRODE REM PT RTRN 9FT ADLT (ELECTROSURGICAL) ×1 IMPLANT
GLOVE BIO SURGEON STRL SZ 6.5 (GLOVE) ×2 IMPLANT
GLOVE BIO SURGEON STRL SZ7 (GLOVE) ×3 IMPLANT
GLOVE BIO SURGEONS STRL SZ 6.5 (GLOVE) ×1
GLOVE BIOGEL PI IND STRL 6.5 (GLOVE) ×1 IMPLANT
GLOVE BIOGEL PI IND STRL 7.0 (GLOVE) ×3 IMPLANT
GLOVE BIOGEL PI INDICATOR 6.5 (GLOVE) ×2
GLOVE BIOGEL PI INDICATOR 7.0 (GLOVE) ×6
GOWN STRL REUS W/TWL LRG LVL3 (GOWN DISPOSABLE) ×9 IMPLANT
KIT TURNOVER KIT A (KITS) ×3 IMPLANT
MANIFOLD NEPTUNE II (INSTRUMENTS) ×3 IMPLANT
NEEDLE HYPO 25X1 1.5 SAFETY (NEEDLE) ×3 IMPLANT
NS IRRIG 1000ML POUR BTL (IV SOLUTION) ×3 IMPLANT
PACK MINOR (CUSTOM PROCEDURE TRAY) ×3 IMPLANT
PAD ARMBOARD 7.5X6 YLW CONV (MISCELLANEOUS) ×3 IMPLANT
SET BASIN LINEN APH (SET/KITS/TRAYS/PACK) ×3 IMPLANT
SUT MNCRL AB 4-0 PS2 18 (SUTURE) ×3 IMPLANT
SUT SILK 2 0 SH (SUTURE) ×3 IMPLANT
SUT VIC AB 3-0 SH 27 (SUTURE) ×2
SUT VIC AB 3-0 SH 27X BRD (SUTURE) ×1 IMPLANT
SYR BULB IRRIGATION 50ML (SYRINGE) ×3 IMPLANT
SYR CONTROL 10ML LL (SYRINGE) ×3 IMPLANT

## 2019-05-23 NOTE — Procedures (Signed)
PreOperative Dx: LEFT breast mass Postoperative Dx: LEFT breast mass Procedure:   Preoperative needle localization of a LEFT breast massunder ultrasound guidance Radiologist:  Thornton Papas Anesthesia:  4 ml of 2% lidocaine Specimen:  None EBL:   < 1 ml Complications: None

## 2019-05-23 NOTE — Anesthesia Procedure Notes (Signed)
Procedure Name: Intubation Date/Time: 05/23/2019 9:42 AM Performed by: Adams, Amy A, CRNA Pre-anesthesia Checklist: Patient identified, Patient being monitored, Timeout performed, Emergency Drugs available and Suction available Patient Re-evaluated:Patient Re-evaluated prior to induction Oxygen Delivery Method: Circle System Utilized Preoxygenation: Pre-oxygenation with 100% oxygen Induction Type: IV induction LMA: LMA inserted LMA Size: 4.0 Number of attempts: 1 Airway Equipment and Method: Stylet Placement Confirmation: breath sounds checked- equal and bilateral and positive ETCO2 Secured at: 21 cm Tube secured with: Tape Dental Injury: Teeth and Oropharynx as per pre-operative assessment

## 2019-05-23 NOTE — Transfer of Care (Signed)
Immediate Anesthesia Transfer of Care Note  Patient: Stephanie Hodges  Procedure(s) Performed: BREAST BIOPSY WITH NEEDLE LOCALIZATION (Left Breast)  Patient Location: PACU  Anesthesia Type:General  Level of Consciousness: awake, alert , oriented and patient cooperative  Airway & Oxygen Therapy: Patient Spontanous Breathing  Post-op Assessment: Report given to RN and Post -op Vital signs reviewed and stable  Post vital signs: Reviewed and stable  Last Vitals:  Vitals Value Taken Time  BP    Temp    Pulse    Resp    SpO2      Last Pain:  Vitals:   05/23/19 0857  TempSrc: Oral  PainSc: 0-No pain         Complications: No apparent anesthesia complications

## 2019-05-23 NOTE — Anesthesia Postprocedure Evaluation (Signed)
Anesthesia Post Note  Patient: Stephanie Hodges  Procedure(s) Performed: BREAST BIOPSY WITH NEEDLE LOCALIZATION (Left Breast)  Patient location during evaluation: PACU Anesthesia Type: General Level of consciousness: awake and alert and oriented Pain management: pain level controlled Vital Signs Assessment: post-procedure vital signs reviewed and stable Respiratory status: spontaneous breathing and respiratory function stable Cardiovascular status: stable Postop Assessment: no apparent nausea or vomiting Anesthetic complications: no     Last Vitals:  Vitals:   05/23/19 0857 05/23/19 1053  BP: 123/76 128/65  Pulse: 60 60  Resp: 18 15  Temp: 36.8 C 36.6 C  SpO2: 98% 99%    Last Pain:  Vitals:   05/23/19 1106  TempSrc:   PainSc: 0-No pain                 ADAMS, AMY A

## 2019-05-23 NOTE — Anesthesia Preprocedure Evaluation (Addendum)
Anesthesia Evaluation    Airway Mallampati: II       Dental  (+) Teeth Intact   Pulmonary former smoker,    breath sounds clear to auscultation       Cardiovascular  Rhythm:regular     Neuro/Psych  Headaches, PSYCHIATRIC DISORDERS Anxiety Depression    GI/Hepatic   Endo/Other    Renal/GU      Musculoskeletal   Abdominal   Peds  Hematology   Anesthesia Other Findings Denies any other sig PMH Unable/refuses to remove nose ring states she  understands burn risk  Reproductive/Obstetrics                            Anesthesia Physical Anesthesia Plan  ASA: II  Anesthesia Plan: General   Post-op Pain Management:    Induction:   PONV Risk Score and Plan:   Airway Management Planned:   Additional Equipment:   Intra-op Plan:   Post-operative Plan:   Informed Consent: I have reviewed the patients History and Physical, chart, labs and discussed the procedure including the risks, benefits and alternatives for the proposed anesthesia with the patient or authorized representative who has indicated his/her understanding and acceptance.       Plan Discussed with: Anesthesiologist  Anesthesia Plan Comments:         Anesthesia Quick Evaluation

## 2019-05-23 NOTE — Interval H&P Note (Signed)
History and Physical Interval Note:  05/23/2019 9:26 AM  Stephanie Hodges  has presented today for surgery, with the diagnosis of left breast tubular adenoma.  The various methods of treatment have been discussed with the patient and family. After consideration of risks, benefits and other options for treatment, the patient has consented to  Procedure(s): BREAST BIOPSY WITH NEEDLE LOCALIZATION (Left) as a surgical intervention.  The patient's history has been reviewed, patient examined, no change in status, stable for surgery.  I have reviewed the patient's chart and labs.  Questions were answered to the patient's satisfaction.    No questions or changes. Needle Loc went well with [place]. Discussed trajectory with Dr. Thornton Papas, mass is at about areola edge.  Virl Cagey

## 2019-05-23 NOTE — Op Note (Signed)
[facility] Operative Note  05/23/19  Preoperative Diagnosis: Left breast mass   Postoperative Diagnosis: Same   Procedure(s) Performed:  Left breast excisional biopsy after needle localization    Surgeon: Lanell Matar. Constance Haw, MD   Assistants: No qualified resident was available    Anesthesia: General endotracheal   Anesthesiologist: Vena Rua, MD    Specimens:  Left breast mass; short superior/ long lateral   Estimated Blood Loss: Minimal   Blood Replacement: None    Complications: None    Wound Class: Clean    Operative Indications:  Ms. Chretien is a 23 yo otherwise healthy female who has had a breast mass for several years that was previously biopsied as a tubular adenoma and has been monitored with serial imaging. The mass has been about the same size, but has been causing her pain, and she came to be to discuss removal.  We discussed non operative management of breast pain and she attempted some of these with some minimal improvement. She also is not sure of her family history.  Due to this the patient wanted to get this mass removed.  I spoke with Dr. Thornton Papas prior to the procedure as he guided the needle with an ultrasound.  The mass is deep and at the areola edge/ nipple complex and the wire goes through the mass   Findings: Large solid mass removed with needle localization wire intact    Procedure: The patient was taken to the operating room and placed supine. General endotracheal anesthesia was induced. Intravenous antibiotics were administered per protocol.  The wire was trimmed down. The left was prepared and draped in the usual sterile fashion.   An incision was made between the wire entry point and the edge of the areola in a curvilinear fashion mimicking the curve of the areola.  This was carried down through the skin and dermis with a knife.  The wire was brought into the field.  A 2-0 Silk stay suture was used to grasp and elevate the  mass. Circumferentially I dissected down along the wire, ensuring adequate specimen and keeping the wire in place.  A circumferential dissection with Metzenbaum scissors was performed. Bleeding was controlled with electrocautery. The specimen did go right to the nipple areola complex duct, and I tried to stay lateral to this as much as possible but some of the deep portions were included in the specimen.  I felt the mass deep and visualized a solid smooth appearing mass that the needle was piercing.  This was excised in its entirety. The specimen was marked short superior and long lateral with a 2-0 silk suture.  The cavity was made hemostatic and irrigated.  The lateral edge of the deep ductal system under the nipple areola complex was closed with a 3-0 vicryl suture.  The remaining cavity was left open to allow for seroma formation.  The skin was closed with 3-0 vicryl suture deep and 4-0 Monocryl running subcuticular and dermabond.  The specimen was sent to Xray, and we received confirmation of the mass being present in the specimen.   All counts were correct at the end of the case. The patient was awakened from anesthesia and extubated without complication.  The patient went to the PACU in stable condition.   Curlene Labrum, MD [facility] [address] [place], [place] [address] [phone] (office)

## 2019-05-23 NOTE — Discharge Instructions (Signed)
Discharge Instructions:  Common Complaints: Pain at the incision site. Bruising and swelling.   Diet/ Activity: Diet as tolerated. You may not have an appetite from anesthesia, but it is important to stay hydrated. Drink 64 ounces of water a day. Your appetite will return with time.  Shower per your regular routine daily.  Do not take hot showers. Take warm showers that are less than 10 minutes. Rest and listen to your body, but do not remain in bed all day.  Do not lift > 10 lbs, perform excessive movement with your left arm for a few days after surgery.  Do not pick at the dermabond glue on your incision sites.  This glue film will remain in place for 1-2 weeks and will start to peel off.  Do not place lotions or balms on your incision unless instructed to specifically by Dr. Constance Haw.  You should remain out of work until Monday 05/28/2019.  This will allow you to heal some prior to returning to work.   Medication: Take tylenol and ibuprofen as needed for pain control, alternating every 4-6 hours.  Example:  Tylenol 1000mg  @ 6am, 12noon, 6pm, 32midnight (Do not exceed 4000mg  of tylenol a day). Ibuprofen 800mg  @ 9am, 3pm, 9pm, 3am (Do not exceed 3600mg  of ibuprofen a day).  Take Roxicodone for breakthrough pain every 4 hours.  Take Colace for constipation related to narcotic pain medication. If you do not have a bowel movement in 2 days, take Miralax over the counter.  Drink plenty of water to also prevent constipation.   Contact Information: If you have questions or concerns, please call our office, [phone], Monday- Thursday 8AM-5PM and Friday 8AM-12Noon.  If it is after hours or on the weekend, please call [facility], [phone], and ask to speak to the surgeon on call for Dr. Constance Haw at [facility].    Breast Biopsy, Care After These instructions give you information about caring for yourself after your procedure. Your doctor may also give you more specific instructions.  Call your doctor if you have any problems or questions after your procedure. What can I expect after the procedure? After your procedure, it is common to have:  Bruising on your breast.  Numbness, tingling, or pain near your biopsy site. Follow these instructions at home: Medicines  Take over-the-counter and prescription medicines only as told by your doctor.  Do not drive for 24 hours if you were given a medicine to help you relax (sedative) during your procedure.  Do not drink alcohol while taking pain medicine.  Do not drive or use heavy machinery while taking prescription pain medicine. Biopsy site care      Follow instructions from your doctor about how to take care of your cut from surgery (incision) or your puncture area. Make sure you: ? Wash your hands with soap and water before you change your bandage (dressing). If you cannot use soap and water, use hand sanitizer. ? Change your bandage as told by your doctor. ? Leave stitches (sutures), skin glue, or skin tape (adhesive strips) in place. They may need to stay in place for 2 weeks or longer. If tape strips get loose and curl up, you may trim the loose edges. Do not remove tape strips completely unless your doctor says it is okay.  You may shower.   Check your cut or puncture area every day for signs of infection. Check for: ? Redness, swelling, or pain. ? Fluid or blood. ? Warmth. ? Pus or  a bad smell.  Protect the biopsy area. Do not let the area get bumped. Activity  If you had a cut during your procedure, avoid activities that could pull your cut open. These include: ? Stretching. ? Reaching over your head. ? Exercise. ? Sports. ? Lifting anything that weighs more than 10 lb (1.4 kg).  Return to your normal activities as told by your doctor. Ask your doctor what activities are safe for you. Managing pain, stiffness, and swelling If told, put ice on the biopsy site to relieve swelling:  Put ice in a  plastic bag.  Place a towel between your skin and the bag.  Leave the ice on for 20 minutes, 2-3 times a day. General instructions  Continue your normal diet.  Wear a good support bra for as long as told by your doctor.  Get checked for extra fluid around your lymph nodes (lymphedema) as often as told by your doctor.  Keep all follow-up visits as told by your doctor. This is important. Contact a doctor if:  You notice any of the following at the biopsy site: ? More redness, swelling, or pain. ? More fluid or blood coming from the site. ? The site feels warm to the touch. ? Pus or a bad smell coming from the site. ? The site breaks open after the stitches or skin tape strips have been removed.  You have a rash.  You have a fever. Get help right away if:  You have more bleeding from the biopsy site. Get help right away if bleeding is more than a small spot.  You have trouble breathing.  You have red streaks around the biopsy site. Summary  After your procedure, it is common to have bruising, numbness, tingling, or pain near the biopsy site.  Do not drive or use heavy machinery while taking prescription pain medicine.  Wear a good support bra for as long as told by your doctor.  If you had a cut during your procedure, avoid activities that may pull the cut open. Ask your doctor what activities are safe for you. This information is not intended to replace advice given to you by your health care provider. Make sure you discuss any questions you have with your health care provider. Document Released: 09/11/2009 Document Revised: 05/04/2018 Document Reviewed: 05/04/2018 Elsevier Interactive Patient Education  2019 [employer].

## 2019-05-24 ENCOUNTER — Encounter (HOSPITAL_COMMUNITY): Payer: Self-pay | Admitting: General Surgery

## 2019-05-30 ENCOUNTER — Encounter (HOSPITAL_COMMUNITY): Payer: Medicaid Other

## 2019-05-30 ENCOUNTER — Telehealth: Payer: Self-pay | Admitting: General Surgery

## 2019-05-30 NOTE — Telephone Encounter (Signed)
[facility]  Notified patient that pathology benign. Left a message.   Curlene Labrum, MD [facility] [address] [place], [place] [address] [phone] (office)

## 2019-06-05 ENCOUNTER — Other Ambulatory Visit: Payer: Self-pay

## 2019-06-05 ENCOUNTER — Telehealth: Payer: Self-pay | Admitting: General Surgery

## 2019-06-05 NOTE — Progress Notes (Signed)
[facility]  I am calling the patient for post operative evaluation due to the current COVID 19 pandemic.  The patient had a excisional biopsy on 05/23/19. The patient reports that they are doing well. The wound is healing nicely, no redness or drainage,  having good pain control, pathology was benign.  The patient has no concerns.   Will see the patient PRN.   Curlene Labrum, MD [facility] [address] [place], [place] [address] [phone] (office)

## 2019-06-13 ENCOUNTER — Other Ambulatory Visit: Payer: Self-pay | Admitting: General Surgery

## 2019-07-09 ENCOUNTER — Other Ambulatory Visit: Payer: Self-pay

## 2019-07-09 ENCOUNTER — Encounter: Payer: Self-pay | Admitting: Emergency Medicine

## 2019-07-09 ENCOUNTER — Emergency Department
Admission: EM | Admit: 2019-07-09 | Discharge: 2019-07-09 | Disposition: A | Discharge status: Home / Self Care | Payer: Medicaid Other | Attending: Emergency Medicine | Admitting: Emergency Medicine

## 2019-07-09 DIAGNOSIS — L508 Other urticaria: Secondary | ICD-10-CM

## 2019-07-09 DIAGNOSIS — L509 Urticaria, unspecified: Secondary | ICD-10-CM | POA: Diagnosis not present

## 2019-07-09 DIAGNOSIS — Z79899 Other long term (current) drug therapy: Secondary | ICD-10-CM | POA: Diagnosis not present

## 2019-07-09 DIAGNOSIS — Z87891 Personal history of nicotine dependence: Secondary | ICD-10-CM | POA: Insufficient documentation

## 2019-07-09 DIAGNOSIS — R21 Rash and other nonspecific skin eruption: Secondary | ICD-10-CM | POA: Diagnosis present

## 2019-07-09 MED ORDER — DEXAMETHASONE SODIUM PHOSPHATE 10 MG/ML IJ SOLN
10.0000 mg | Freq: Once | INTRAMUSCULAR | Status: AC
  Administered 2019-07-09: 17:00:00 10 mg via INTRAMUSCULAR
  Filled 2019-07-09: qty 1

## 2019-07-09 MED ORDER — HYDROXYZINE HCL 25 MG PO TABS
25.0000 mg | ORAL_TABLET | Freq: Once | ORAL | Status: AC
  Administered 2019-07-09: 25 mg via ORAL
  Filled 2019-07-09: qty 1

## 2019-07-09 MED ORDER — FAMOTIDINE 20 MG PO TABS: 20.0000 mg | ORAL_TABLET | Freq: Two times a day (BID) | ORAL | 0 refills | Status: DC

## 2019-07-09 MED ORDER — HYDROXYZINE HCL 25 MG PO TABS: 25.0000 mg | ORAL_TABLET | Freq: Three times a day (TID) | ORAL | 0 refills | Status: DC | PRN

## 2019-07-09 MED ORDER — FAMOTIDINE 20 MG PO TABS
20.0000 mg | ORAL_TABLET | Freq: Once | ORAL | Status: AC
  Administered 2019-07-09: 20 mg via ORAL
  Filled 2019-07-09: qty 1

## 2019-07-09 NOTE — ED Notes (Signed)
See triage note.  States she developed several areas of redness and itching   Describes discomfort as "burning"

## 2019-07-09 NOTE — Discharge Instructions (Signed)
Please return to the ER for any swelling of the lips, tongue, or for any difficulty breathing.

## 2019-07-09 NOTE — ED Provider Notes (Signed)
[facility] Emergency Department Provider Note  ____________________________________________  Time seen: Approximately 4:30 PM  I have reviewed the triage vital signs and the nursing notes.   HISTORY  Chief Complaint Rash   HPI Stephanie Hodges is a 23 y.o. female who presents to the emergency department for treatment and evaluation of rash that started this afternoon after being at her mother's health.  No known new exposures.  She denies changing her laundry or hygiene products.  She denies having history of allergic reaction similar to this.  No relief with Benadryl this afternoon.   Past Medical History:  Diagnosis Date  . 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 Contraceptive education 10/03/2014  . Headache(784.0)    migraines  . History of chlamydia   . Nexplanon insertion 10/16/2014   Inserted left arm 10/16/14 remove 10/16/17  . No pertinent past medical history   . Pregnant   . Seasonal allergies     Patient Active Problem List   Diagnosis Date Noted  . Tubular adenoma of left female breast 02/06/2019  . Abnormal Papanicolaou smear of cervix with positive human papilloma virus (HPV) test 01/16/2019  . Mass of upper outer quadrant of left breast 01/11/2019  . Menstrual cramps 01/11/2019  . Encounter for surveillance of contraceptive pills 01/11/2019  . Anxiety and depression 01/11/2019  . Routine cervical smear 01/11/2019  . Encounter for gynecological examination with Papanicolaou smear of cervix 01/11/2019  . Screening examination for STD (sexually transmitted disease) 12/07/2018  . Vaginal bleeding 12/07/2018  . History of abnormal cervical Pap smear 12/07/2018  . 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 Encounter for Nexplanon removal 10/31/2017  . Nexplanon in place  10/24/2017  . Abnormal uterine bleeding (AUB) 10/24/2017  . Encounter for initial prescription of contraceptive pills 10/24/2017  . Depression 10/24/2017  . Pregnancy examination or test, negative result 10/24/2017  . Well woman exam with routine gynecological exam 10/16/2014  . Contraceptive education 10/03/2014    Past Surgical History:  Procedure Laterality Date  . BREAST BIOPSY Left 05/23/2019   Procedure: BREAST BIOPSY WITH NEEDLE LOCALIZATION;  Surgeon: Virl Cagey, MD;  Location: [facility];  Service: General;  Laterality: Left;  . NO PAST SURGERIES      Prior to Admission medications   Medication Sig Start Date End Date Taking? Authorizing Provider  acetaminophen (TYLENOL) 325 MG tablet Take 650 mg by mouth every 6 (six) hours as needed (pain).     [provider]  docusate sodium (COLACE) 100 MG capsule Take 1 capsule (100 mg total) by mouth 2 (two) times daily. 05/23/19 05/22/20  Virl Cagey, MD  escitalopram (LEXAPRO) 20 MG tablet Take 1 tablet (20 mg total) by mouth daily. 01/11/19   Estill Dooms, NP  famotidine (PEPCID) 20 MG tablet Take 1 tablet (20 mg total) by mouth 2 (two) times daily for 14 days. 07/09/19 07/23/19  Hanako Tipping, Johnette Abraham B, FNP  hydrOXYzine (ATARAX/VISTARIL) 25 MG tablet Take 1 tablet (25 mg total) by mouth 3 (three) times daily as needed. 07/09/19   Julitza Rickles B, FNP  ibuprofen (ADVIL) 200 MG tablet Take 400 mg by mouth every 8 (eight) hours as needed for mild pain or moderate pain.     [provider]    Allergies Coconut flavor [flavoring agent] and Mushroom extract  complex  Family History  Problem Relation Age of Onset  . Hypertension Mother   . Stroke Mother     Social History Social History   Tobacco Use  . Smoking status: Former Smoker    Packs/day: 0.22    Types: Cigarettes  . Smokeless tobacco: Never Used  Substance Use Topics  . Alcohol use: No  . Drug use: No    Review of Systems  Constitutional:  Negative for fever. Respiratory: Negative for cough or shortness of breath.  Musculoskeletal: Negative for myalgias Skin: Positive for urticarial lesions. Neurological: Negative for numbness or paresthesias. ____________________________________________   PHYSICAL EXAM:  VITAL SIGNS: ED Triage Vitals  Enc Vitals Group     BP 07/09/19 1539 121/70     Pulse Rate 07/09/19 1539 78     Resp 07/09/19 1539 16     Temp 07/09/19 1539 99 F (37.2 C)     Temp Source 07/09/19 1539 Oral     SpO2 07/09/19 1539 99 %     Weight 07/09/19 1535 173 lb 1 oz (78.5 kg)     Height --      Head Circumference --      Peak Flow --      Pain Score 07/09/19 1535 0     Pain Loc --      Pain Edu? --      Excl. in [place]? --      Constitutional: Well appearing. Eyes: Conjunctivae are clear without discharge or drainage. Nose: No rhinorrhea noted. Mouth/Throat: Airway is patent.  Neck: No stridor. Unrestricted range of motion observed. Cardiovascular: Capillary refill is <3 seconds.  Respiratory: Respirations are even and unlabored.. Musculoskeletal: Unrestricted range of motion observed. Neurologic: Awake, alert, and oriented x 4.  Skin: Scattered hives over extremities and trunk  ____________________________________________   LABS (all labs ordered are listed, but only abnormal results are displayed)  Labs Reviewed - No data to display ____________________________________________  EKG  Not indicated. ____________________________________________  RADIOLOGY  Not indicated ____________________________________________   PROCEDURES  Procedures ____________________________________________   INITIAL IMPRESSION / ASSESSMENT AND PLAN / ED COURSE  [name] is a 23 y.o. female presenting to the emergency department for treatment and evaluation of urticarial lesions.  She will be given an injection of Decadron and Pepcid plus hydroxyzine tablets.  She will be discharged with hydroxyzine  and Pepcid.  She is to return to the emergency department if she begins to feel as if she is having a hard time breathing or has any swelling in her lips or tongue.  She was advised to attempt to figure out what it was that she was exposed to today and avoid what ever that was.   Medications  dexamethasone (DECADRON) injection 10 mg (10 mg Intramuscular Given 07/09/19 1637)  famotidine (PEPCID) tablet 20 mg (20 mg Oral Given 07/09/19 1637)  hydrOXYzine (ATARAX/VISTARIL) tablet 25 mg (25 mg Oral Given 07/09/19 1637)     Pertinent labs & imaging results that were available during my care of the patient were reviewed by me and considered in my medical decision making (see chart for details).  ____________________________________________   FINAL CLINICAL IMPRESSION(S) / ED DIAGNOSES  Final diagnoses:  Urticaria, acute    ED Discharge Orders         Ordered    famotidine (PEPCID) 20 MG tablet  2 times daily     07/09/19 1639    hydrOXYzine (ATARAX/VISTARIL) 25 MG tablet  3 times daily PRN  07/09/19 1639           Note:  This document was prepared using Dragon voice recognition software and may include unintentional dictation errors.   Victorino Dike, FNP 07/09/19 1641    Nance Pear, MD 07/09/19 1721

## 2019-07-09 NOTE — ED Triage Notes (Signed)
C/O rash to body.  Onset of symptoms earlier today.  C/O painful and itching rash.  AAOx3.  NAD.  No SOB.  Voice clear and strong.

## 2019-09-04 ENCOUNTER — Other Ambulatory Visit: Payer: Self-pay | Admitting: Adult Health

## 2019-09-04 MED ORDER — NAPROXEN 375 MG PO TABS: ORAL_TABLET | ORAL | 1 refills | Status: DC

## 2019-09-04 NOTE — Progress Notes (Unsigned)
rx naprosyn

## 2019-09-13 ENCOUNTER — Other Ambulatory Visit: Payer: Medicaid Other

## 2019-09-14 ENCOUNTER — Other Ambulatory Visit: Payer: Medicaid Other

## 2019-09-17 ENCOUNTER — Other Ambulatory Visit: Payer: Medicaid Other

## 2020-03-04 ENCOUNTER — Other Ambulatory Visit: Payer: Self-pay

## 2020-03-04 ENCOUNTER — Other Ambulatory Visit (INDEPENDENT_AMBULATORY_CARE_PROVIDER_SITE_OTHER): Payer: Medicaid Other

## 2020-03-04 ENCOUNTER — Other Ambulatory Visit (HOSPITAL_COMMUNITY)
Admission: RE | Admit: 2020-03-04 | Discharge: 2020-03-04 | Disposition: A | Discharge status: Home / Self Care | Payer: Medicaid Other | Source: Ambulatory Visit | Attending: Obstetrics & Gynecology | Admitting: Obstetrics & Gynecology

## 2020-03-04 DIAGNOSIS — Z113 Encounter for screening for infections with a predominantly sexual mode of transmission: Secondary | ICD-10-CM | POA: Insufficient documentation

## 2020-03-04 NOTE — Progress Notes (Signed)
   NURSE VISIT- VAGINITIS/STD/POC  SUBJECTIVE:  Stephanie Hodges is a 24 y.o. G1P0101 GYN patientfemale here for a vaginal swab for STD screening..  She reports the following symptoms: none. .  OBJECTIVE:  There were no vitals taken for this visit.  Appears well, in no apparent distress  ASSESSMENT: Vaginal swab for STD screening  PLAN: Self-collected  Swab. sent to lab Treatment: to be determined once results are received Follow-up as needed if symptoms persist/worsen, or new symptoms develop  Ladonna Snide  03/04/2020 10:14 AM

## 2020-03-05 LAB — CERVICOVAGINAL ANCILLARY ONLY
Chlamydia: NEGATIVE
Comment: NEGATIVE
Comment: NORMAL
Neisseria Gonorrhea: NEGATIVE

## 2020-08-11 ENCOUNTER — Emergency Department: Payer: Medicaid Other

## 2020-08-11 ENCOUNTER — Emergency Department
Admission: EM | Admit: 2020-08-11 | Discharge: 2020-08-11 | Disposition: A | Discharge status: Home / Self Care | Payer: Medicaid Other | Attending: Emergency Medicine | Admitting: Emergency Medicine

## 2020-08-11 ENCOUNTER — Encounter: Payer: Self-pay | Admitting: Emergency Medicine

## 2020-08-11 ENCOUNTER — Other Ambulatory Visit: Payer: Self-pay

## 2020-08-11 DIAGNOSIS — M79671 Pain in right foot: Secondary | ICD-10-CM | POA: Diagnosis present

## 2020-08-11 DIAGNOSIS — G9009 Other idiopathic peripheral autonomic neuropathy: Secondary | ICD-10-CM | POA: Diagnosis not present

## 2020-08-11 DIAGNOSIS — Z87891 Personal history of nicotine dependence: Secondary | ICD-10-CM | POA: Insufficient documentation

## 2020-08-11 DIAGNOSIS — G609 Hereditary and idiopathic neuropathy, unspecified: Secondary | ICD-10-CM

## 2020-08-11 MED ORDER — LIDOCAINE 5 % EX PTCH
1.0000 | MEDICATED_PATCH | CUTANEOUS | Status: DC
  Administered 2020-08-11: 1 via TRANSDERMAL
  Filled 2020-08-11: qty 1

## 2020-08-11 MED ORDER — METHYLPREDNISOLONE 4 MG PO TBPK: ORAL_TABLET | ORAL | 0 refills | Status: DC

## 2020-08-11 MED ORDER — LIDOCAINE 5 % EX PTCH: 1.0000 | MEDICATED_PATCH | Freq: Two times a day (BID) | CUTANEOUS | 0 refills | Status: AC

## 2020-08-11 MED ORDER — LIDOCAINE 5 % EX PTCH: 1.0000 | MEDICATED_PATCH | Freq: Two times a day (BID) | CUTANEOUS | 0 refills | Status: DC

## 2020-08-11 MED ORDER — PREDNISONE 20 MG PO TABS
60.0000 mg | ORAL_TABLET | Freq: Once | ORAL | Status: AC
  Administered 2020-08-11: 60 mg via ORAL
  Filled 2020-08-11: qty 3

## 2020-08-11 NOTE — ED Provider Notes (Signed)
[facility] Emergency Department Provider Note   ____________________________________________   First MD Initiated Contact with Patient 08/11/20 1002     (approximate)  I have reviewed the triage vital signs and the nursing notes.   HISTORY  Chief Complaint Ankle Pain    HPI Stephanie Hodges is a 24 y.o. female patient presents with right nontraumatic lateral foot pain.  Patient states pain started couple days ago and was worse with a.m. awakening.  Patient rates pain to 7/10.  Patient stated pain is sharp on the left lateral fifth metatarsal.  Patient state burning/stinging sensation dorsal aspect of the toes.  No palliative measures for complaint.         Past Medical History:  Diagnosis Date   Abnormal Papanicolaou smear of cervix with positive human papilloma virus (HPV) test 01/16/2019   LGSIL with +HPV, will get colpo   ASCUS with positive high risk HPV cervical 10/31/2017   Contraceptive education 10/03/2014   Headache(784.0)    migraines   History of chlamydia    Nexplanon insertion 10/16/2014   Inserted left arm 10/16/14 remove 10/16/17   No pertinent past medical history    Pregnant    Seasonal allergies     Patient Active Problem List   Diagnosis Date Noted   Tubular adenoma of left female breast 02/06/2019   Abnormal Papanicolaou smear of cervix with positive human papilloma virus (HPV) test 01/16/2019   Mass of upper outer quadrant of left breast 01/11/2019   Menstrual cramps 01/11/2019   Encounter for surveillance of contraceptive pills 01/11/2019   Anxiety and depression 01/11/2019   Routine cervical smear 01/11/2019   Encounter for gynecological examination with Papanicolaou smear of cervix 01/11/2019   Screening examination for STD (sexually transmitted disease) 12/07/2018   Vaginal bleeding 12/07/2018   History of abnormal cervical Pap smear 12/07/2018   ASCUS with positive high risk HPV cervical  10/31/2017   Encounter for Nexplanon removal 10/31/2017   Nexplanon in place 10/24/2017   Abnormal uterine bleeding (AUB) 10/24/2017   Encounter for initial prescription of contraceptive pills 10/24/2017   Depression 10/24/2017   Pregnancy examination or test, negative result 10/24/2017   Well woman exam with routine gynecological exam 10/16/2014   Contraceptive education 10/03/2014    Past Surgical History:  Procedure Laterality Date   BREAST BIOPSY Left 05/23/2019   Procedure: BREAST BIOPSY WITH NEEDLE LOCALIZATION;  Surgeon: Virl Cagey, MD;  Location: [facility];  Service: General;  Laterality: Left;   NO PAST SURGERIES      Prior to Admission medications   Medication Sig Start Date End Date Taking? Authorizing Provider  acetaminophen (TYLENOL) 325 MG tablet Take 650 mg by mouth every 6 (six) hours as needed (pain).     [provider]  escitalopram (LEXAPRO) 20 MG tablet Take 1 tablet (20 mg total) by mouth daily. 01/11/19   Estill Dooms, NP  famotidine (PEPCID) 20 MG tablet Take 1 tablet (20 mg total) by mouth 2 (two) times daily for 14 days. 07/09/19 07/23/19  Triplett, Johnette Abraham B, FNP  hydrOXYzine (ATARAX/VISTARIL) 25 MG tablet Take 1 tablet (25 mg total) by mouth 3 (three) times daily as needed. 07/09/19   Triplett, Cari B, FNP  ibuprofen (ADVIL) 200 MG tablet Take 400 mg by mouth every 8 (eight) hours as needed for mild pain or moderate pain.     [provider]  lidocaine (LIDODERM) 5 % Place 1 patch onto the skin every 12 (twelve) hours. Remove &  Discard patch within 12 hours or as directed by MD 08/11/20 08/11/21  Sable Feil, PA-C  methylPREDNISolone (MEDROL DOSEPAK) 4 MG TBPK tablet Take Tapered dose as directed 08/11/20   Sable Feil, PA-C  naproxen (NAPROSYN) 375 MG tablet Take 1 every 12 hours with food, prn cramps 09/04/19   Estill Dooms, NP    Allergies Coconut flavor [flavoring agent] and Mushroom extract complex  Family  History  Problem Relation Age of Onset   Hypertension Mother    Stroke Mother     Social History Social History   Tobacco Use   Smoking status: Former Smoker    Packs/day: 0.22    Types: Cigarettes   Smokeless tobacco: Never Used  Scientific laboratory technician Use: Never used  Substance Use Topics   Alcohol use: No   Drug use: No    Review of Systems Constitutional: No fever/chills Eyes: No visual changes. ENT: No sore throat. Cardiovascular: Denies chest pain. Respiratory: Denies shortness of breath. Gastrointestinal: No abdominal pain.  No nausea, no vomiting.  No diarrhea.  No constipation. Genitourinary: Negative for dysuria. Musculoskeletal: Right foot pain. Skin: Negative for rash. Neurological: Negative for headaches, focal weakness or numbness. Psychiatric:  Anxiety and depression. Allergic/Immunilogical: [place]. ____________________________________________   PHYSICAL EXAM:  VITAL SIGNS: ED Triage Vitals  Enc Vitals Group     BP 08/11/20 1004 133/84     Pulse Rate 08/11/20 1004 95     Resp 08/11/20 1004 18     Temp 08/11/20 1004 98.5 F (36.9 C)     Temp Source 08/11/20 1004 Oral     SpO2 08/11/20 1004 99 %     Weight 08/11/20 0933 180 lb (81.6 kg)     Height 08/11/20 0933 5\' 5"  (1.651 m)     Head Circumference --      Peak Flow --      Pain Score 08/11/20 0933 7     Pain Loc --      Pain Edu? --      Excl. in [place]? --     Constitutional: Alert and oriented. Well appearing and in no acute distress. Cardiovascular: Normal rate, regular rhythm. Grossly normal heart sounds.  Good peripheral circulation. Respiratory: Normal respiratory effort.  No retractions. Lungs CTAB. Musculoskeletal: No obvious deformity to the right foot.  Moderate guarding palpation lateral aspect of the right foot.  No joint effusions. Neurologic:  Normal speech and language. No gross focal neurologic deficits are appreciated. No gait instability. Skin:  Skin is  warm, dry and intact. No rash noted. Psychiatric: Mood and affect are normal. Speech and behavior are normal.  ____________________________________________   LABS (all labs ordered are listed, but only abnormal results are displayed)  Labs Reviewed - No data to display ____________________________________________  EKG   ____________________________________________  RADIOLOGY  ED MD interpretation:    Official radiology report(s): DG Foot Complete Right  Result Date: 08/11/2020 CLINICAL DATA:  Pain EXAM: RIGHT FOOT COMPLETE - 3+ VIEW COMPARISON:  None. FINDINGS: Frontal, oblique, and lateral views were obtained. No fracture or dislocation. Joint spaces appear normal. No erosive change. IMPRESSION: No fracture or dislocation.  No evident arthropathy. Electronically Signed   By: Lowella Grip III M.D.   On: 08/11/2020 11:13    ____________________________________________   PROCEDURES  Procedure(s) performed (including Critical Care):  Procedures   ____________________________________________   INITIAL IMPRESSION / ASSESSMENT AND PLAN / ED COURSE  As part of my medical decision making, I reviewed  the following data within the [place]     Patient presents with nontraumatic right foot pain with intermittent numbness. Discussed no acute findings on x-ray. Patient given discharge care instruction peripheral neuropathy and advised to follow-up with neurology for definitive evaluation and treatment.          ____________________________________________   FINAL CLINICAL IMPRESSION(S) / ED DIAGNOSES  Final diagnoses:  Right foot pain  Idiopathic peripheral neuropathy     ED Discharge Orders         Ordered    lidocaine (LIDODERM) 5 %  Every 12 hours,   Status:  Discontinued        08/11/20 1123    methylPREDNISolone (MEDROL DOSEPAK) 4 MG TBPK tablet  Status:  Discontinued        08/11/20 1123    lidocaine (LIDODERM) 5 %  Every 12 hours         08/11/20 1124    methylPREDNISolone (MEDROL DOSEPAK) 4 MG TBPK tablet        08/11/20 1124          Please note:  Stephanie Hodges was evaluated in Emergency Department on 08/11/2020 for the symptoms described in the history of present illness. She was evaluated in the context of the global COVID-19 pandemic, which necessitated consideration that the patient might be at risk for infection with the SARS-CoV-2 virus that causes COVID-19. Institutional protocols and algorithms that pertain to the evaluation of patients at risk for COVID-19 are in a state of rapid change based on information released by regulatory bodies including the CDC and federal and state organizations. These policies and algorithms were followed during the patient's care in the ED.  Some ED evaluations and interventions may be delayed as a result of limited staffing during and the pandemic.*   Note:  This document was prepared using Dragon voice recognition software and may include unintentional dictation errors.    Sable Feil, PA-C 08/11/20 1129    Carrie Mew, MD 08/13/20 [phone]

## 2020-08-11 NOTE — ED Triage Notes (Signed)
Pt reports started with a little bit of pain to her right ankle a couple of days ago and it was worse today. Pt denies injuires

## 2020-08-11 NOTE — Discharge Instructions (Signed)
Follow discharge care instruction take medication as directed. Ambulate with crutches for 2 to 3 days. Follow-up with neurology for definitive evaluation and treatment.

## 2021-04-06 ENCOUNTER — Other Ambulatory Visit (HOSPITAL_COMMUNITY)
Admission: RE | Admit: 2021-04-06 | Discharge: 2021-04-06 | Disposition: A | Discharge status: Home / Self Care | Payer: Medicaid Other | Source: Ambulatory Visit | Attending: Obstetrics & Gynecology | Admitting: Obstetrics & Gynecology

## 2021-04-06 ENCOUNTER — Other Ambulatory Visit: Payer: Self-pay

## 2021-04-06 ENCOUNTER — Other Ambulatory Visit (INDEPENDENT_AMBULATORY_CARE_PROVIDER_SITE_OTHER): Payer: Medicaid Other

## 2021-04-06 DIAGNOSIS — N926 Irregular menstruation, unspecified: Secondary | ICD-10-CM | POA: Diagnosis not present

## 2021-04-06 DIAGNOSIS — Z113 Encounter for screening for infections with a predominantly sexual mode of transmission: Secondary | ICD-10-CM

## 2021-04-06 DIAGNOSIS — Z3202 Encounter for pregnancy test, result negative: Secondary | ICD-10-CM

## 2021-04-06 LAB — POCT URINE PREGNANCY: Preg Test, Ur: NEGATIVE

## 2021-04-06 NOTE — Progress Notes (Signed)
Chart reviewed for nurse visit. Agree with plan of care.  Estill Dooms, NP 04/06/2021 3:54 PM

## 2021-04-06 NOTE — Progress Notes (Signed)
   NURSE VISIT- VAGINITIS/STD/POC  SUBJECTIVE:  Stephanie Hodges is a 25 y.o. G1P0101 GYN patientfemale here for a vaginal swab for STD screen.  She reports the following symptoms: none for N/A  Denies abnormal vaginal bleeding, significant pelvic pain, fever, or UTI symptoms.  OBJECTIVE:  There were no vitals taken for this visit.  Appears well, in no apparent distress  ASSESSMENT: Vaginal swab for STD screen  PLAN: Self-collected vaginal probe for Gonorrhea, Chlamydia, Trichomonas sent to lab Treatment: to be determined once results are received Follow-up as needed if symptoms persist/worsen, or new symptoms develop    NURSE VISIT- PREGNANCY CONFIRMATION   SUBJECTIVE:  Stephanie Hodges is a 70 y.o. G89P0101 female at Unknown by certain LMP of Patient's last menstrual period was 03/09/2021 (exact date). Here for possible pregnancy.  OBJECTIVE:  LMP 03/09/2021 (Exact Date)   Appears well, in no apparent distress OB History  Gravida Para Term Preterm AB Living  1 1   1   1   SAB IAB Ectopic Multiple Live Births          1    # Outcome Date GA Lbr Len/2nd Weight Sex Delivery Anes PTL Lv  1 Preterm 07/07/12 [redacted]w[redacted]d 26:05 / 01:41 5 lb 8 oz (2.495 kg) F Vag-Spont EPI  LIV     Birth Comments: molding    Results for orders placed or performed in visit on 04/06/21 (from the past 24 hour(s))  POCT urine pregnancy   Collection Time: 04/06/21  3:31 PM  Result Value Ref Range   Preg Test, Ur Negative Negative    ASSESSMENT: Negative pregnancy test    PLAN: Return PRN  Chari Parmenter A Jyaire Koudelka  04/06/2021 3:31 PM

## 2021-04-08 LAB — CERVICOVAGINAL ANCILLARY ONLY
Chlamydia: NEGATIVE
Comment: NEGATIVE
Comment: NEGATIVE
Comment: NORMAL
Neisseria Gonorrhea: NEGATIVE
Trichomonas: NEGATIVE

## 2021-04-11 IMAGING — US US PLC BREAST LOC DEV 1ST LESION  INC US GUIDE*L*
1 series · 7 of 7 positions shown · non-contrast
Comparison: Ultrasound 01/16/2019

CLINICAL DATA: LEFT breast mass, unknown family history

EXAM:
NEEDLE LOCALIZATION OF THE LEFT BREAST WITH ULTRASOUND GUIDANCE

[Series 1: us plc breast loc dev 1st lesion inc us guide*left · 7 of 7 slices shown]
[im 1/7]
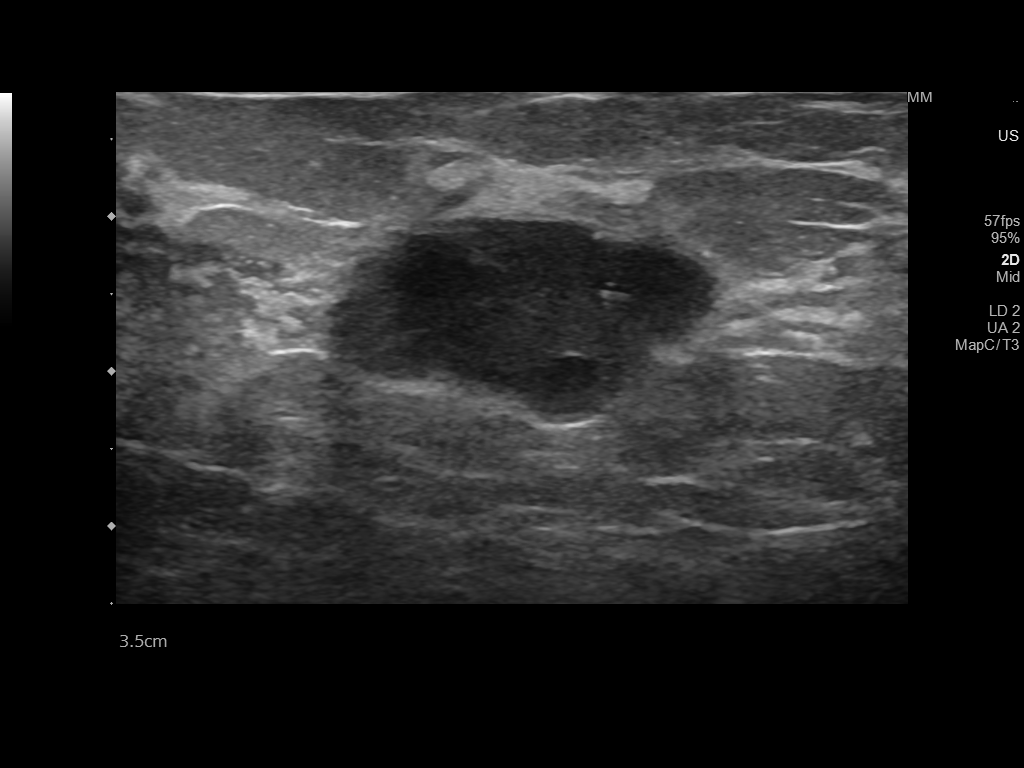
[im 2/7]
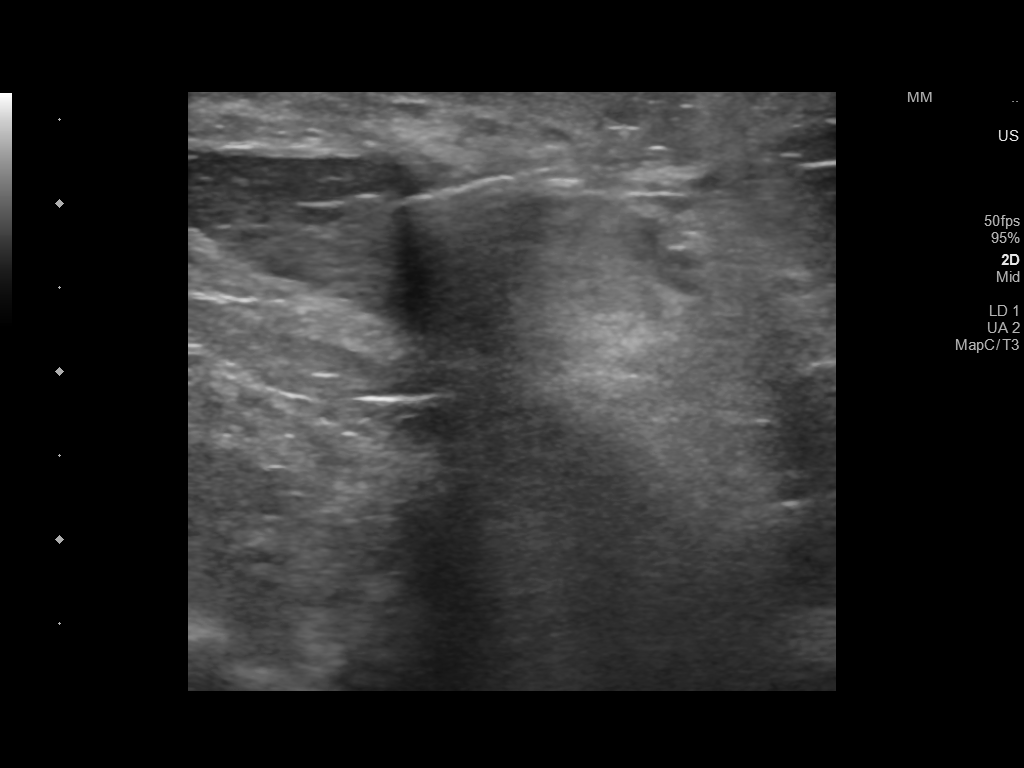
[im 3/7]
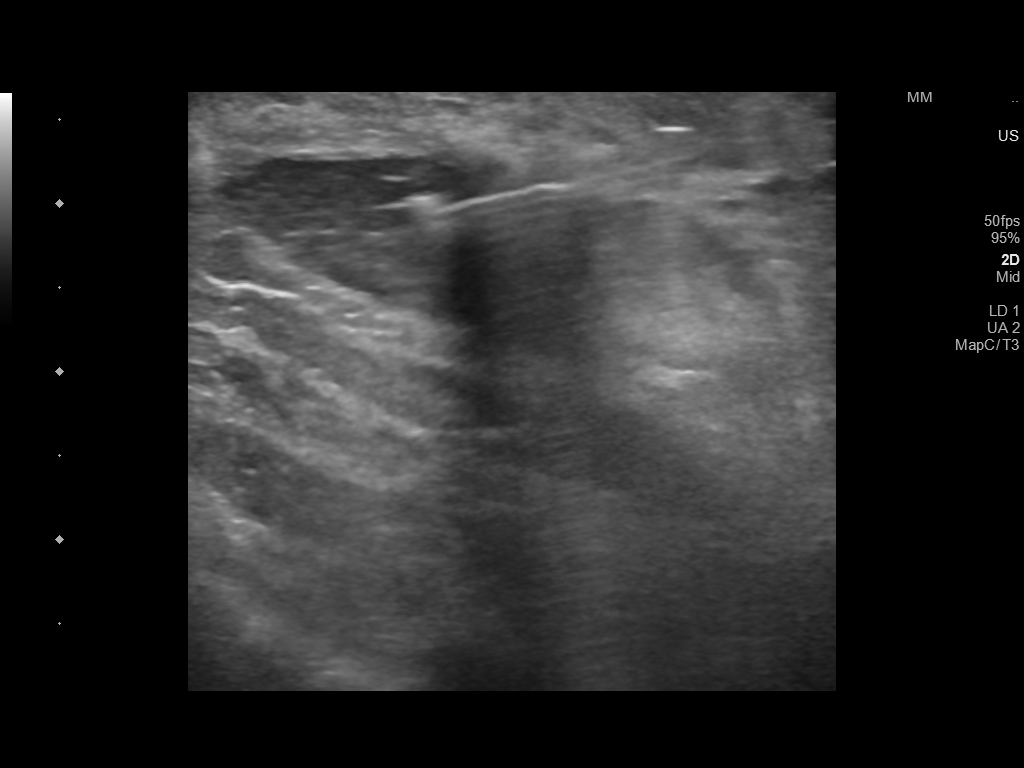
[im 4/7]
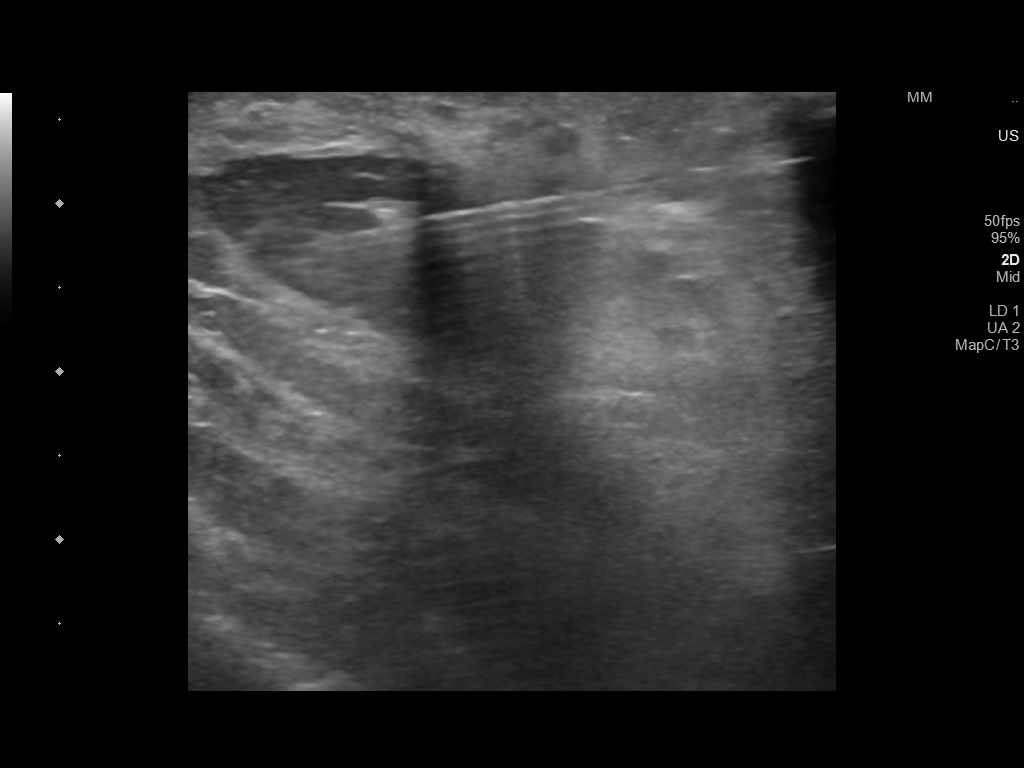
[im 5/7]
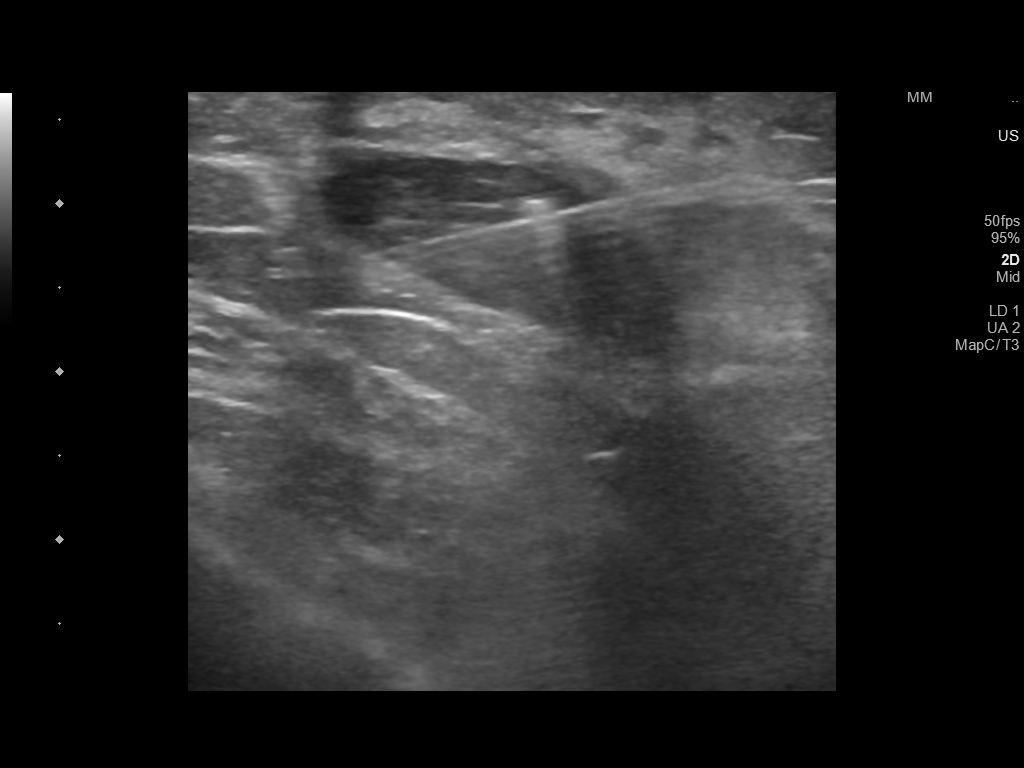
[im 6/7]
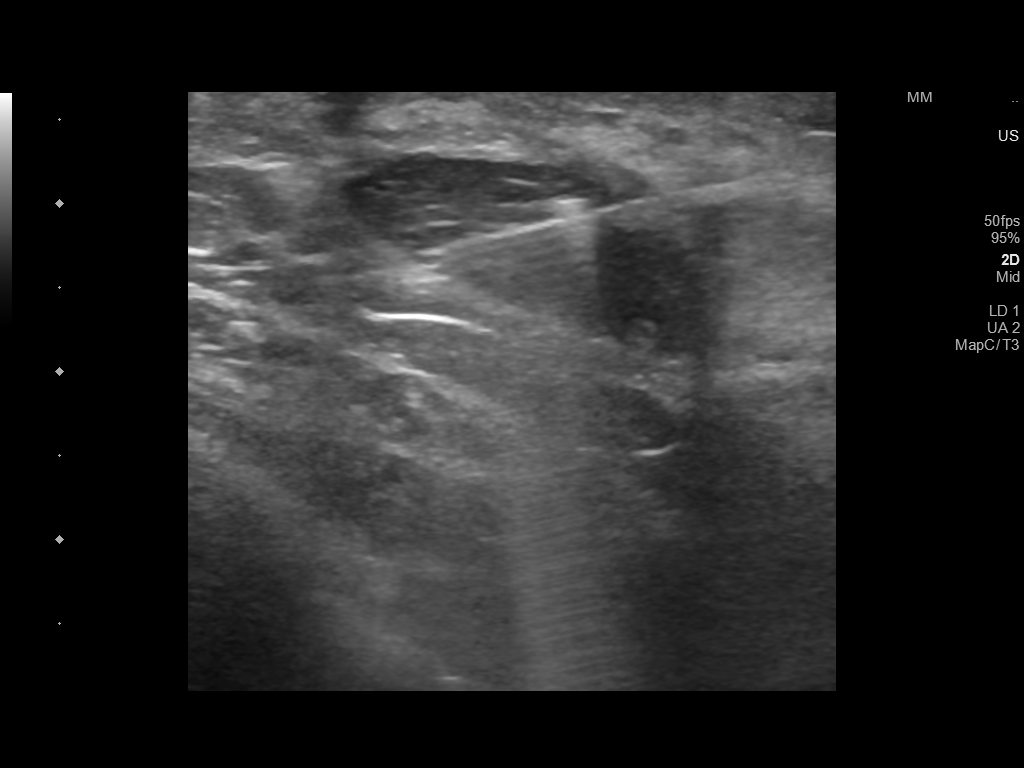
[im 7/7]
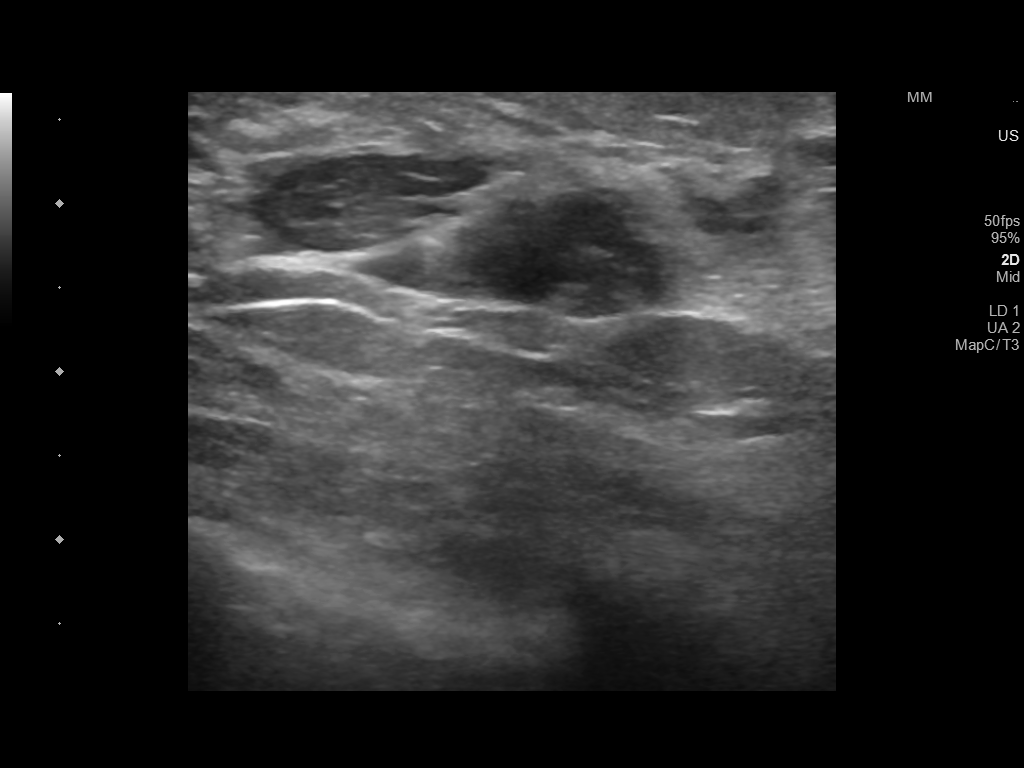

[7 of 7 positions shown; findings below may reference images not displayed]

FINDINGS: Patient presents for needle localization prior to excisional biopsy
of a previously identified LEFT breast mass. I met with the patient
and we discussed the procedure of needle localization including
benefits and alternatives. We discussed the high likelihood of a
successful procedure. We discussed the risks of the procedure,
including infection, bleeding, tissue injury, and further surgery.
Informed, written consent was given. The usual time-out protocol was
performed immediately prior to the procedure.

Using ultrasound guidance, sterile technique, 4 mL of 2% lidocaine
and a 7 cm length modified Kopans needle, the LEFT periareolar
nodule at 3-4 o'clock localized using a free-hand lateral to medial
approach. The images were marked for Dr. Jarrell.
IMPRESSION: Needle localization LEFT breast. No apparent complications.

## 2021-04-20 ENCOUNTER — Telehealth: Payer: Self-pay | Admitting: Obstetrics & Gynecology

## 2021-04-20 NOTE — Telephone Encounter (Signed)
Pt needs refill on her BC   Has PAP scheduled here for 5/27  Please advise & notify pt   [facility]

## 2021-04-21 ENCOUNTER — Telehealth: Payer: Self-pay | Admitting: Adult Health

## 2021-04-21 NOTE — Telephone Encounter (Signed)
Duchess called back, she is not on birht control has appt Friday to discuss

## 2021-04-21 NOTE — Telephone Encounter (Signed)
Left message no birth control current, please call me

## 2021-04-24 ENCOUNTER — Other Ambulatory Visit: Payer: Medicaid Other | Admitting: Obstetrics & Gynecology

## 2021-05-05 ENCOUNTER — Other Ambulatory Visit: Payer: Medicaid Other | Admitting: Adult Health

## 2021-05-12 ENCOUNTER — Other Ambulatory Visit: Payer: Medicaid Other | Admitting: Adult Health

## 2021-11-04 ENCOUNTER — Encounter: Payer: Self-pay | Admitting: Obstetrics & Gynecology

## 2021-11-04 ENCOUNTER — Other Ambulatory Visit: Payer: Self-pay

## 2021-11-04 ENCOUNTER — Ambulatory Visit (INDEPENDENT_AMBULATORY_CARE_PROVIDER_SITE_OTHER): Payer: Medicaid Other

## 2021-11-04 VITALS — BP 140/84 | HR 80 | Ht 65.0 in | Wt 162.0 lb

## 2021-11-04 DIAGNOSIS — Z3201 Encounter for pregnancy test, result positive: Secondary | ICD-10-CM | POA: Diagnosis not present

## 2021-11-04 DIAGNOSIS — Z3200 Encounter for pregnancy test, result unknown: Secondary | ICD-10-CM

## 2021-11-04 LAB — POCT URINE PREGNANCY: Preg Test, Ur: POSITIVE — AB

## 2021-11-04 NOTE — Progress Notes (Addendum)
   NURSE VISIT- PREGNANCY CONFIRMATION   SUBJECTIVE:  Stephanie Hodges is a 25 y.o. G36P0101 female at [redacted]w[redacted]d by certain LMP of Patient's last menstrual period was 09/27/2021 (exact date). Here for pregnancy confirmation.  Home pregnancy test: positive x 3   She reports cramping.  She is not taking prenatal vitamins.    OBJECTIVE:  BP 140/84 (BP Location: Right Arm, Patient Position: Sitting, Cuff Size: Normal)   Pulse 80   Ht 5\' 5"  (1.651 m)   Wt 162 lb (73.5 kg)   LMP 09/27/2021 (Exact Date)   BMI 26.96 kg/m   Appears well, in no apparent distress  Results for orders placed or performed in visit on 11/04/21 (from the past 24 hour(s))  POCT urine pregnancy   Collection Time: 11/04/21 11:13 AM  Result Value Ref Range   Preg Test, Ur Positive (A) Negative    ASSESSMENT: Positive pregnancy test, [redacted]w[redacted]d by LMP    PLAN: Schedule for dating ultrasound in 2-4 weeks Prenatal vitamins: plans to begin OTC ASAP   Nausea medicines: not currently needed   OB packet given: Yes  Esmerelda Finnigan A Daiden Coltrane  11/04/2021 11:18 AM   Chart reviewed for nurse visit. Agree with plan of care.  Myrtis Ser, CNM 11/04/2021 12:38 PM

## 2021-11-17 ENCOUNTER — Telehealth: Payer: Self-pay | Admitting: Women's Health

## 2021-11-17 ENCOUNTER — Other Ambulatory Visit: Payer: Self-pay | Admitting: Women's Health

## 2021-11-17 MED ORDER — DOXYLAMINE-PYRIDOXINE 10-10 MG PO TBEC: DELAYED_RELEASE_TABLET | ORAL | 6 refills | Status: DC

## 2021-11-17 NOTE — Telephone Encounter (Signed)
Returned pt's call, two identifiers used. Pt has tried all the tips and tricks in the new OB booklet with no success. Pt requesting a prescription to be sent in to [facility] in [place]. Pt informed msg would be sent to a provider. Pt confirmed understanding.

## 2021-11-17 NOTE — Telephone Encounter (Signed)
Patient having a lot of morning sickness. Please advise on what she can do for it or take.

## 2021-11-29 NOTE — L&D Delivery Note (Addendum)
Delivery & Post Placental IUD placement Note Stephanie Hodges is a 26 y.o. G2P0101 at 2w2dadmitted for gHTN w/ SIPE w/ SF.   GBS Status: Negative/-- (07/06 1640) Maximum Maternal Temperature: 98.1  Labor course: Initial SVE: closed. Augmentation with: AROM, Pitocin, and Cytotec. She then progressed to complete.  ROM: 3h 49mith clear fluid  Birth: At 1253 a viable female was delivered via spontaneous vaginal delivery (Presentation: LOA ). Nuchal cord present: Yes x1, head delivered with initially tight nuchal that was able to be reduced prior to completion of delivery.  Shoulders and body delivered in usual fashion after reduction of nuchal by Dr. DaCy BlamerInfant placed directly on mom's abdomen for bonding/skin-to-skin, baby dried and stimulated. Cord clamped x 2 after 1 minute and cut by grandmother of baby.  Cord blood collected.  The placenta separated spontaneously and delivered via gentle cord traction.  Pitocin infused rapidly IV per protocol.  Fundus firm with massage.   Placenta inspected and appears to be intact with a 3 VC.  Placenta/Cord with the following complications: None .    At that time a manual sweep was done of the uterus and a few small clots were removed. The Mirena IUD was removed from its packaging and applicator. The IUD was placed between index and middle finger and advanced to the fundus. The fundus was grasped with non-dominant hand externally (on abdomen) while the IUD was confirmed to be in the correct position with my dominant hand. Once confirmed appropriate position hand removed and the strings were cut ~1.5cm inside the uterus.   Perineum, vagina and cervix were carefully inspected and patient was noted to have a superficial laceration above the urethra that was repaired with 2 interrupted sutures using a 4-0 monocryl.   Sponge and instrument count were correct x2.  Intrapartum complications:  None Anesthesia:  epidural Episiotomy: none Lacerations:   periurethral Suture Repair:  4-0 monocryl EBL (mL): 75   Infant: APGAR (1 MIN): 8   APGAR (5 MINS): 9   APGAR (10 MINS):    Infant weight: pending  Mom to postpartum.  Baby to Couplet care / Skin to Skin. Placenta to L&D   Plans to Breastfeed Contraception: IUD placed Circumcision: wants inpatient   AsFabiola BackerMD 06/15/2022 1:22 PM   GME ATTESTATION:  I saw and evaluated the patient. I agree with the findings and the plan of care as documented in the resident's note and have made all necessary edits. I was present and gloved for entire delivery and placed the post placental IUD  AnRenard MatterMD, MPH OB Fellow, [place]/18/2023 2:26 PM

## 2021-12-04 ENCOUNTER — Other Ambulatory Visit: Payer: Self-pay | Admitting: Obstetrics & Gynecology

## 2021-12-04 DIAGNOSIS — O3680X0 Pregnancy with inconclusive fetal viability, not applicable or unspecified: Secondary | ICD-10-CM

## 2021-12-07 ENCOUNTER — Ambulatory Visit (INDEPENDENT_AMBULATORY_CARE_PROVIDER_SITE_OTHER): Payer: Medicaid Other

## 2021-12-07 ENCOUNTER — Encounter: Payer: Self-pay | Admitting: Adult Health

## 2021-12-07 ENCOUNTER — Other Ambulatory Visit: Payer: Self-pay

## 2021-12-07 DIAGNOSIS — O3680X0 Pregnancy with inconclusive fetal viability, not applicable or unspecified: Secondary | ICD-10-CM | POA: Diagnosis not present

## 2021-12-07 DIAGNOSIS — Z3A10 10 weeks gestation of pregnancy: Secondary | ICD-10-CM

## 2021-12-07 NOTE — Written Directive (Signed)
[place] 10+1 wks,single IUP with YS,FHR 173 bpm,CRL 31.56 mm,normal ovaries

## 2021-12-18 ENCOUNTER — Encounter (HOSPITAL_COMMUNITY): Payer: Self-pay | Admitting: Obstetrics & Gynecology

## 2021-12-18 ENCOUNTER — Inpatient Hospital Stay (HOSPITAL_COMMUNITY)
Admission: AD | Admit: 2021-12-18 | Discharge: 2021-12-18 | Disposition: A | Discharge status: Home / Self Care | Payer: Medicaid Other | Attending: Obstetrics & Gynecology | Admitting: Obstetrics & Gynecology

## 2021-12-18 DIAGNOSIS — R1013 Epigastric pain: Secondary | ICD-10-CM | POA: Diagnosis not present

## 2021-12-18 DIAGNOSIS — Z3A11 11 weeks gestation of pregnancy: Secondary | ICD-10-CM | POA: Diagnosis not present

## 2021-12-18 DIAGNOSIS — O98511 Other viral diseases complicating pregnancy, first trimester: Secondary | ICD-10-CM | POA: Diagnosis not present

## 2021-12-18 DIAGNOSIS — O26891 Other specified pregnancy related conditions, first trimester: Secondary | ICD-10-CM | POA: Diagnosis present

## 2021-12-18 DIAGNOSIS — J111 Influenza due to unidentified influenza virus with other respiratory manifestations: Secondary | ICD-10-CM | POA: Diagnosis not present

## 2021-12-18 DIAGNOSIS — Z87891 Personal history of nicotine dependence: Secondary | ICD-10-CM | POA: Diagnosis not present

## 2021-12-18 LAB — URINALYSIS, ROUTINE W REFLEX MICROSCOPIC
Bilirubin Urine: NEGATIVE
Glucose, UA: NEGATIVE mg/dL
Hgb urine dipstick: NEGATIVE
Ketones, ur: NEGATIVE mg/dL
Leukocytes,Ua: NEGATIVE
Nitrite: NEGATIVE
Protein, ur: NEGATIVE mg/dL
Specific Gravity, Urine: 1.008 (ref 1.005–1.030)
pH: 6 (ref 5.0–8.0)

## 2021-12-18 MED ORDER — ALUM & MAG HYDROXIDE-SIMETH 200-200-20 MG/5ML PO SUSP
30.0000 mL | Freq: Once | ORAL | Status: DC
  Filled 2021-12-18: qty 30

## 2021-12-18 MED ORDER — FAMOTIDINE 20 MG PO TABS: 20.0000 mg | ORAL_TABLET | Freq: Every day | ORAL | 1 refills | Status: AC

## 2021-12-18 MED ORDER — LIDOCAINE VISCOUS HCL 2 % MT SOLN
15.0000 mL | Freq: Once | OROMUCOSAL | Status: DC
  Filled 2021-12-18: qty 15

## 2021-12-18 NOTE — MAU Provider Note (Signed)
History     CSN: [id number]  Arrival date and time: 12/18/21 1942   Event Date/Time   First Provider Initiated Contact with Patient 12/18/21 2141      Chief Complaint  Patient presents with   Abdominal Pain   Stephanie Hodges is a 26 y.o. G2P0101 at [redacted]w[redacted]d who receives care at [facility].  She presents  today for Abdominal Pain.  She states the pain started this morning around 0615.  She states she tried to lay down and sleep, but she woke from sleep due to the pain.  She states she ate and drank fluids, but the pain continued.  She states the pain is intensified with laying flat on back, but did not improve with heat application or position change. She describes the pain as "a stomach ache or hunger pains" and states the pain is similar to that of Covid.  She reports she took a Covid test and it was negative, but does have the flu.  She reports she has been taking Tamiflu on the 18th. She also reports that she recently finished a 17 day fast!  She also reports that she has taken pepcid in the past for known heartburn.    OB History     Gravida  2   Para  1   Term      Preterm  1   AB      Living  1      SAB      IAB      Ectopic      Multiple      Live Births  1           Past Medical History:  Diagnosis Date   Abnormal Papanicolaou smear of cervix with positive human papilloma virus (HPV) test 01/16/2019   LGSIL with +HPV, will get colpo   ASCUS with positive high risk HPV cervical 10/31/2017   Contraceptive education 10/03/2014   Headache(784.0)    migraines   History of chlamydia    Nexplanon insertion 10/16/2014   Inserted left arm 10/16/14 remove 10/16/17   No pertinent past medical history    Pregnant    Seasonal allergies     Past Surgical History:  Procedure Laterality Date   BREAST BIOPSY Left 05/23/2019   Procedure: BREAST BIOPSY WITH NEEDLE LOCALIZATION;  Surgeon: Virl Cagey, MD;  Location: [facility];  Service: General;  Laterality: Left;    NO PAST SURGERIES      Family History  Problem Relation Age of Onset   Hypertension Mother    Stroke Mother     Social History   Tobacco Use   Smoking status: Former    Packs/day: 0.22    Types: Cigarettes   Smokeless tobacco: Never  Vaping Use   Vaping Use: Never used  Substance Use Topics   Alcohol use: No   Drug use: No    Allergies:  Allergies  Allergen Reactions   Coconut Flavor [Flavoring Agent] Swelling and Rash    Medications Prior to Admission  Medication Sig Dispense Refill Last Dose   acetaminophen (TYLENOL) 325 MG tablet Take 650 mg by mouth every 6 (six) hours as needed (pain).    12/18/2021   Oseltamivir Phosphate (TAMIFLU PO) Take by mouth.      Doxylamine-Pyridoxine (DICLEGIS) 10-10 MG TBEC 2 tabs q hs, if sx persist add 1 tab q am on day 3, if sx persist add 1 tab q afternoon on day 4 100 tablet 6  escitalopram (LEXAPRO) 20 MG tablet Take 1 tablet (20 mg total) by mouth daily. (Patient not taking: Reported on 11/04/2021) 30 tablet 11    famotidine (PEPCID) 20 MG tablet Take 1 tablet (20 mg total) by mouth 2 (two) times daily for 14 days. (Patient not taking: Reported on 11/04/2021) 28 tablet 0    hydrOXYzine (ATARAX/VISTARIL) 25 MG tablet Take 1 tablet (25 mg total) by mouth 3 (three) times daily as needed. (Patient not taking: Reported on 11/04/2021) 30 tablet 0     Review of Systems  Constitutional:  Negative for chills and fever.  HENT:  Positive for congestion.   Respiratory:  Negative for cough and shortness of breath.   Gastrointestinal:  Positive for abdominal pain and vomiting (X3 today). Negative for nausea.  Genitourinary:  Negative for vaginal bleeding and vaginal discharge.  Neurological:  Positive for headaches.  Physical Exam   Blood pressure 116/65, pulse 92, temperature 98.1 F (36.7 C), resp. rate 18, height 5\' 5"  (1.651 m), weight 73.5 kg, last menstrual period 09/27/2021.  Physical Exam Vitals reviewed.  Constitutional:       General: She is not in acute distress.    Appearance: Normal appearance. She is well-developed.  HENT:     Head: Normocephalic and atraumatic.  Eyes:     Conjunctiva/sclera: Conjunctivae normal.  Cardiovascular:     Rate and Rhythm: Normal rate.  Pulmonary:     Effort: Pulmonary effort is normal. No respiratory distress.  Abdominal:     General: Abdomen is flat.     Palpations: Abdomen is soft.     Tenderness: There is abdominal tenderness in the epigastric area.  Musculoskeletal:        General: Normal range of motion.     Cervical back: Normal range of motion.  Skin:    General: Skin is warm and dry.  Neurological:     Mental Status: She is alert and oriented to person, place, and time.  Psychiatric:        Mood and Affect: Mood normal.        Behavior: Behavior normal.    MAU Course  Procedures No results found for this or any previous visit (from the past 24 hour(s)).  MDM Exam GI Cocktail Assessment and Plan  26 year old G35P0101 SIUP at 11.5 weeks Epigastric Pain Influenza  -POC Reviewed. -Discussed h/o heartburn and management. -Informed of need to take antacids and/or PPI daily. -Discussed nutritional intake that promotes decreased symptoms. -Instructed to excuse self from religious fasting while pregnant. -Educated on effects of fasting during pregnancy. -Will give GI cocktail and reassess.  Maryann Conners 12/18/2021, 9:41 PM   Reassessment (10:34 PM) -Patient reports relief with GI Cocktail. -Instructed to keep appt for 1/25 as scheduled. -Rx for pepcid sent to pharmacy on file. -Informed that she can use Tums or other chewable antacid as needed.  -Encouraged to call primary office or return to MAU if symptoms worsen or with the onset of new symptoms. -Discharged to home in improved condition.  Maryann Conners MSN, CNM Advanced Practice Provider, [facility]

## 2021-12-18 NOTE — MAU Note (Signed)
Pt stated she started having pain in her epigastric area that radiates down to her umbilicus that started this morning. Pain is ache and constant. Has had some vomiting 3 times today but that has been an on going issue since she has been pregnant.  Dx with the flu 2 days ago.unsure if this pain is related to that or not.  Took tylenol yesterday to try to help with pain did not help. Taking tamaflu as prescribed.

## 2021-12-22 ENCOUNTER — Other Ambulatory Visit: Payer: Self-pay | Admitting: Obstetrics & Gynecology

## 2021-12-22 DIAGNOSIS — Z3682 Encounter for antenatal screening for nuchal translucency: Secondary | ICD-10-CM

## 2021-12-22 DIAGNOSIS — Z3490 Encounter for supervision of normal pregnancy, unspecified, unspecified trimester: Secondary | ICD-10-CM | POA: Insufficient documentation

## 2021-12-23 ENCOUNTER — Ambulatory Visit (INDEPENDENT_AMBULATORY_CARE_PROVIDER_SITE_OTHER): Payer: Medicaid Other | Admitting: Advanced Practice Midwife

## 2021-12-23 ENCOUNTER — Encounter: Payer: Self-pay | Admitting: Advanced Practice Midwife

## 2021-12-23 ENCOUNTER — Other Ambulatory Visit: Payer: Self-pay

## 2021-12-23 ENCOUNTER — Ambulatory Visit: Payer: Medicaid Other | Admitting: *Deleted

## 2021-12-23 ENCOUNTER — Ambulatory Visit (INDEPENDENT_AMBULATORY_CARE_PROVIDER_SITE_OTHER): Payer: Medicaid Other

## 2021-12-23 VITALS — BP 110/74 | HR 75 | Wt 165.0 lb

## 2021-12-23 DIAGNOSIS — R87618 Other abnormal cytological findings on specimens from cervix uteri: Secondary | ICD-10-CM

## 2021-12-23 DIAGNOSIS — Z3A12 12 weeks gestation of pregnancy: Secondary | ICD-10-CM | POA: Diagnosis not present

## 2021-12-23 DIAGNOSIS — Z3682 Encounter for antenatal screening for nuchal translucency: Secondary | ICD-10-CM | POA: Diagnosis not present

## 2021-12-23 DIAGNOSIS — Z3480 Encounter for supervision of other normal pregnancy, unspecified trimester: Secondary | ICD-10-CM

## 2021-12-23 LAB — POCT URINALYSIS DIPSTICK OB
Glucose, UA: NEGATIVE
Ketones, UA: NEGATIVE
Leukocytes, UA: NEGATIVE
Nitrite, UA: NEGATIVE
POC,PROTEIN,UA: NEGATIVE

## 2021-12-23 MED ORDER — ASPIRIN 81 MG PO CHEW: 162.0000 mg | CHEWABLE_TABLET | Freq: Every day | ORAL | 7 refills | Status: DC

## 2021-12-23 MED ORDER — BLOOD PRESSURE MONITOR MISC: 0 refills | Status: AC

## 2021-12-23 NOTE — Progress Notes (Signed)
[place] 12+3 wks,measurements c/w dates,CRL 64.83 mm,FHR 1712 bpm,NB present,NT 1.3 mm,normal ovaries,posterior placenta

## 2021-12-23 NOTE — Progress Notes (Signed)
INITIAL OBSTETRICAL VISIT Patient name: Stephanie Hodges MRN [id number]  Date of birth: [birth year] Chief Complaint:   Initial Prenatal Visit  History of Present Illness:   Stephanie Hodges is a 26 y.o. G58P0101 African-American female at [redacted]w[redacted]d by LMP c/w u/s at 10.1 weeks with an Estimated Date of Delivery: 07/04/22 being seen today for her initial obstetrical visit.   Patient's last menstrual period was 09/27/2021 (exact date). Her obstetrical history is significant for  PPROM/PTD @ 36.0wks; 70yrs since delivery .   Today she reports no complaints.  Last pap Jan 11, 2019. Results were: LSIL w/ HRHPV positive: type not specified; colpo normal on Feb 2019> rec for annual Pap (will get with next visit)  Depression screen [facility] 2/9 12/23/2021 01/11/2019 12/07/2018 10/24/2017  Decreased Interest 0 0 - 0  Down, Depressed, Hopeless 0 0 1 3  PHQ - 2 Score 0 0 1 3  Altered sleeping 0 - - 1  Tired, decreased energy 1 - - 1  Change in appetite 0 - - 2  Feeling bad or failure about yourself  0 - - 1  Trouble concentrating 0 - - 2  Moving slowly or fidgety/restless 0 - - 1  Suicidal thoughts 0 - - 0  PHQ-9 Score 1 - - 11  Difficult doing work/chores - - - Very difficult     GAD 7 : Generalized Anxiety Score 12/23/2021  Nervous, Anxious, on Edge 0  Control/stop worrying 0  Worry too much - different things 0  Trouble relaxing 0  Restless 0  Easily annoyed or irritable 0  Afraid - awful might happen 0  Total GAD 7 Score 0     Review of Systems:   Pertinent items are noted in HPI Denies cramping/contractions, leakage of fluid, vaginal bleeding, abnormal vaginal discharge w/ itching/odor/irritation, headaches, visual changes, shortness of breath, chest pain, abdominal pain, severe nausea/vomiting, or problems with urination or bowel movements unless otherwise stated above.  Pertinent History Reviewed:  Reviewed past medical,surgical, social, obstetrical and family history.  Reviewed problem list,  medications and allergies. OB History  Gravida Para Term Preterm AB Living  2 1   1   1   SAB IAB Ectopic Multiple Live Births          1    # Outcome Date GA Lbr Len/2nd Weight Sex Delivery Anes PTL Lv  2 Current           1 Preterm 07/07/12 [redacted]w[redacted]d 26:05 / 01:41 5 lb 8 oz (2.495 kg) F Vag-Spont EPI N LIV     Birth Comments: molding     Complications: Preterm premature rupture of membranes   Physical Assessment:   Vitals:   12/23/21 1445  BP: 110/74  Pulse: 75  Weight: 165 lb (74.8 kg)  Body mass index is 27.46 kg/m.       Physical Examination:  General appearance - well appearing, and in no distress  Mental status - alert, oriented to person, place, and time  Psych:  She has a normal mood and affect  Skin - warm and dry, normal color, no suspicious lesions noted  Chest - effort normal, all lung fields clear to auscultation bilaterally  Heart - normal rate and regular rhythm  Abdomen - soft, nontender  Extremities:  No swelling or varicosities noted  Pelvic - not indicated  Thin prep pap is not done    TODAY'S NT [place] 12+3 wks,measurements c/w dates,CRL 64.83 mm,FHR 1712 bpm,NB present,NT 1.3 mm,normal ovaries,posterior placenta  Results for orders placed or performed in visit on 12/23/21 (from the past 24 hour(s))  POC Urinalysis Dipstick OB   Collection Time: 12/23/21  3:13 PM  Result Value Ref Range   Color, UA     Clarity, UA     Glucose, UA Negative Negative   Bilirubin, UA     Ketones, UA neg    Spec Grav, UA     Blood, UA trace    pH, UA     POC,PROTEIN,UA Negative Negative, Trace, Small (1+), Moderate (2+), Large (3+), 4+   Urobilinogen, UA     Nitrite, UA neg    Leukocytes, UA Negative Negative   Appearance     Odor      Assessment & Plan:  1) Low-Risk Pregnancy G2P0101 at [redacted]w[redacted]d with an Estimated Date of Delivery: 07/04/22   2) Initial OB visit  3) Hx PPROM/PTD @ 36wks, discussion re Makena, she will think about it and let us know at her next  visit  4) Hx LSIL Pap w nl colpo, needs Pap at next visit  Meds:  Meds ordered this encounter  Medications   Blood Pressure Monitor MISC    Sig: For regular home bp monitoring during pregnancy    Dispense:  1 each    Refill:  0    Z34.81 Please mail to patient   aspirin 81 MG chewable tablet    Sig: Chew 2 tablets (162 mg total) by mouth daily.    Dispense:  60 tablet    Refill:  7    Order Specific Question:   Supervising Provider    Answer:   Janyth Pupa [[id number]]    Initial labs obtained Continue prenatal vitamins Reviewed n/v relief measures and warning s/s to report Reviewed recommended weight gain based on pre-gravid BMI Encouraged well-balanced diet Genetic & carrier screening discussed: requests Panor[facility] and NT/IT, requests Horizon  Ultrasound discussed; fetal survey: requested [place] completed> form faxed if has or is planning to apply for medicaid The nature of [place] for [employer] with multiple MDs and other Advanced Practice Providers was explained to patient; also emphasized that fellows, residents, and students are part of our team. Does not have home bp cuff. Office bp cuff given: no. Rx sent: yes. Check bp weekly, let us know if consistently >140/90.   Indications for ASA therapy (per uptodate) OR Two or more of the following: Sociodemographic characteristics (African American race, low socioeconomic level) Yes Personal risk factors (eg, previous pregnancy w/ LBW or SGA, previous adverse pregnancy outcome [eg, stillbirth], interval >10 years between pregnancies) Yes (PPROM 36wks in 2013)  Indications for early A1C (per uptodate) BMI >=25 (>=23 in Asian women) AND one of the following High-risk race/ethnicity (eg, African American, Latino, Native American, [place] American, [place]) Yes  Follow-up: Return in about 4 weeks (around 01/20/2022) for LROB, 2nd IT, in person.   Orders Placed This Encounter  Procedures   Urine Culture    GC/Chlamydia Probe Amp   Integrated 1   Genetic Screening   CBC/D/Plt+RPR+Rh+ABO+RubIgG...   HgB A1c   POC Urinalysis Dipstick OB    Myrtis Ser [facility] 12/23/2021 3:31 PM

## 2021-12-23 NOTE — Patient Instructions (Addendum)
Arionna, thank you for choosing our office today! We appreciate the opportunity to meet your healthcare needs. You may receive a short survey by mail, e-mail, or through [employer]. If you are happy with your care we would appreciate if you could take just a few minutes to complete the survey questions. We read all of your comments and take your feedback very seriously. Thank you again for choosing our office.  [facility] at [place] at [facility] ([place], [place] [address]) Entrance C, located off of [address] parking   The medicine to decrease preterm delivery is called MAKENA (or 17 P)   Nausea & Vomiting Have saltine crackers or pretzels by your bed and eat a few bites before you raise your head out of bed in the morning Eat small frequent meals throughout the day instead of large meals Drink plenty of fluids throughout the day to stay hydrated, just don't drink a lot of fluids with your meals.  This can make your stomach fill up faster making you feel sick Do not brush your teeth right after you eat Products with real ginger are good for nausea, like ginger ale and ginger hard candy Make sure it says made with real ginger! Sucking on sour candy like lemon heads is also good for nausea If your prenatal vitamins make you nauseated, take them at night so you will sleep through the nausea Sea Bands If you feel like you need medicine for the nausea & vomiting please let us know If you are unable to keep any fluids or food down please let us know   Constipation Drink plenty of fluid, preferably water, throughout the day Eat foods high in fiber such as fruits, vegetables, and grains Exercise, such as walking, is a good way to keep your bowels regular Drink warm fluids, especially warm prune juice, or decaf coffee Eat a 1/2 cup of real oatmeal (not instant), 1/2 cup applesauce, and 1/2-1 cup warm prune juice  every day If needed, you may take Colace (docusate sodium) stool softener once or twice a day to help keep the stool soft.  If you still are having problems with constipation, you may take Miralax once daily as needed to help keep your bowels regular.   Home Blood Pressure Monitoring for Patients   Your provider has recommended that you check your blood pressure (BP) at least once a week at home. If you do not have a blood pressure cuff at home, one will be provided for you. Contact your provider if you have not received your monitor within 1 week.   Helpful Tips for Accurate Home Blood Pressure Checks  Don't smoke, exercise, or drink caffeine 30 minutes before checking your BP Use the restroom before checking your BP (a full bladder can raise your pressure) Relax in a comfortable upright chair Feet on the ground Left arm resting comfortably on a flat surface at the level of your heart Legs uncrossed Back supported Sit quietly and don't talk Place the cuff on your bare arm Adjust snuggly, so that only two fingertips can fit between your skin and the top of the cuff Check 2 readings separated by at least one minute Keep a log of your BP readings For a visual, please reference this diagram: http://ccnc.care/bpdiagram  Provider Name: [facility]     Phone: [phone]  Zone 1: ALL CLEAR  Continue to monitor your symptoms:  BP reading is less than 140 (top number) or less than 90 (bottom number)  No right upper stomach pain No headaches or seeing spots No feeling nauseated or throwing up No swelling in face and hands  Zone 2: CAUTION Call your doctor's office for any of the following:  BP reading is greater than 140 (top number) or greater than 90 (bottom number)  Stomach pain under your ribs in the middle or right side Headaches or seeing spots Feeling nauseated or throwing up Swelling in face and hands  Zone 3: EMERGENCY  Seek immediate medical care if you have any of  the following:  BP reading is greater than160 (top number) or greater than 110 (bottom number) Severe headaches not improving with Tylenol Serious difficulty catching your breath Any worsening symptoms from Zone 2    First Trimester of Pregnancy The first trimester of pregnancy is from week 1 until the end of week 12 (months 1 through 3). A week after a sperm fertilizes an egg, the egg will implant on the wall of the uterus. This embryo will begin to develop into a baby. Genes from you and your partner are forming the baby. The female genes determine whether the baby is a boy or a girl. At 6-8 weeks, the eyes and face are formed, and the heartbeat can be seen on ultrasound. At the end of 12 weeks, all the baby's organs are formed.  Now that you are pregnant, you will want to do everything you can to have a healthy baby. Two of the most important things are to get good prenatal care and to follow your health care provider's instructions. Prenatal care is all the medical care you receive before the baby's birth. This care will help prevent, find, and treat any problems during the pregnancy and childbirth. BODY CHANGES Your body goes through many changes during pregnancy. The changes vary from woman to woman.  You may gain or lose a couple of pounds at first. You may feel sick to your stomach (nauseous) and throw up (vomit). If the vomiting is uncontrollable, call your health care provider. You may tire easily. You may develop headaches that can be relieved by medicines approved by your health care provider. You may urinate more often. Painful urination may mean you have a bladder infection. You may develop heartburn as a result of your pregnancy. You may develop constipation because certain hormones are causing the muscles that push waste through your intestines to slow down. You may develop hemorrhoids or swollen, bulging veins (varicose veins). Your breasts may begin to grow larger and become  tender. Your nipples may stick out more, and the tissue that surrounds them (areola) may become darker. Your gums may bleed and may be sensitive to brushing and flossing. Dark spots or blotches (chloasma, mask of pregnancy) may develop on your face. This will likely fade after the baby is born. Your menstrual periods will stop. You may have a loss of appetite. You may develop cravings for certain kinds of food. You may have changes in your emotions from day to day, such as being excited to be pregnant or being concerned that something may go wrong with the pregnancy and baby. You may have more vivid and strange dreams. You may have changes in your hair. These can include thickening of your hair, rapid growth, and changes in texture. Some women also have hair loss during or after pregnancy, or hair that feels dry or thin. Your hair will most likely return  to normal after your baby is born. WHAT TO EXPECT AT YOUR PRENATAL VISITS During a routine prenatal visit: You will be weighed to make sure you and the baby are growing normally. Your blood pressure will be taken. Your abdomen will be measured to track your baby's growth. The fetal heartbeat will be listened to starting around week 10 or 12 of your pregnancy. Test results from any previous visits will be discussed. Your health care provider may ask you: How you are feeling. If you are feeling the baby move. If you have had any abnormal symptoms, such as leaking fluid, bleeding, severe headaches, or abdominal cramping. If you have any questions. Other tests that may be performed during your first trimester include: Blood tests to find your blood type and to check for the presence of any previous infections. They will also be used to check for low iron levels (anemia) and Rh antibodies. Later in the pregnancy, blood tests for diabetes will be done along with other tests if problems develop. Urine tests to check for infections, diabetes, or  protein in the urine. An ultrasound to confirm the proper growth and development of the baby. An amniocentesis to check for possible genetic problems. Fetal screens for spina bifida and Down syndrome. You may need other tests to make sure you and the baby are doing well. HOME CARE INSTRUCTIONS  Medicines Follow your health care provider's instructions regarding medicine use. Specific medicines may be either safe or unsafe to take during pregnancy. Take your prenatal vitamins as directed. If you develop constipation, try taking a stool softener if your health care provider approves. Diet Eat regular, well-balanced meals. Choose a variety of foods, such as meat or vegetable-based protein, fish, milk and low-fat dairy products, vegetables, fruits, and whole grain breads and cereals. Your health care provider will help you determine the amount of weight gain that is right for you. Avoid raw meat and uncooked cheese. These carry germs that can cause birth defects in the baby. Eating four or five small meals rather than three large meals a day may help relieve nausea and vomiting. If you start to feel nauseous, eating a few soda crackers can be helpful. Drinking liquids between meals instead of during meals also seems to help nausea and vomiting. If you develop constipation, eat more high-fiber foods, such as fresh vegetables or fruit and whole grains. Drink enough fluids to keep your urine clear or pale yellow. Activity and Exercise Exercise only as directed by your health care provider. Exercising will help you: Control your weight. Stay in shape. Be prepared for labor and delivery. Experiencing pain or cramping in the lower abdomen or low back is a good sign that you should stop exercising. Check with your health care provider before continuing normal exercises. Try to avoid standing for long periods of time. Move your legs often if you must stand in one place for a long time. Avoid heavy  lifting. Wear low-heeled shoes, and practice good posture. You may continue to have sex unless your health care provider directs you otherwise. Relief of Pain or Discomfort Wear a good support bra for breast tenderness.   Take warm sitz baths to soothe any pain or discomfort caused by hemorrhoids. Use hemorrhoid cream if your health care provider approves.   Rest with your legs elevated if you have leg cramps or low back pain. If you develop varicose veins in your legs, wear support hose. Elevate your feet for 15 minutes, 3-4 times a day.  Limit salt in your diet. Prenatal Care Schedule your prenatal visits by the twelfth week of pregnancy. They are usually scheduled monthly at first, then more often in the last 2 months before delivery. Write down your questions. Take them to your prenatal visits. Keep all your prenatal visits as directed by your health care provider. Safety Wear your seat belt at all times when driving. Make a list of emergency phone numbers, including numbers for family, friends, the hospital, and police and fire departments. General Tips Ask your health care provider for a referral to a local prenatal education class. Begin classes no later than at the beginning of month 6 of your pregnancy. Ask for help if you have counseling or nutritional needs during pregnancy. Your health care provider can offer advice or refer you to specialists for help with various needs. Do not use hot tubs, steam rooms, or saunas. Do not douche or use tampons or scented sanitary pads. Do not cross your legs for long periods of time. Avoid cat litter boxes and soil used by cats. These carry germs that can cause birth defects in the baby and possibly loss of the fetus by miscarriage or stillbirth. Avoid all smoking, herbs, alcohol, and medicines not prescribed by your health care provider. Chemicals in these affect the formation and growth of the baby. Schedule a dentist appointment. At home, brush  your teeth with a soft toothbrush and be gentle when you floss. SEEK MEDICAL CARE IF:  You have dizziness. You have mild pelvic cramps, pelvic pressure, or nagging pain in the abdominal area. You have persistent nausea, vomiting, or diarrhea. You have a bad smelling vaginal discharge. You have pain with urination. You notice increased swelling in your face, hands, legs, or ankles. SEEK IMMEDIATE MEDICAL CARE IF:  You have a fever. You are leaking fluid from your vagina. You have spotting or bleeding from your vagina. You have severe abdominal cramping or pain. You have rapid weight gain or loss. You vomit blood or material that looks like coffee grounds. You are exposed to [place] measles and have never had them. You are exposed to fifth disease or chickenpox. You develop a severe headache. You have shortness of breath. You have any kind of trauma, such as from a fall or a car accident. Document Released: 11/09/2001 Document Revised: 04/01/2014 Document Reviewed: 09/25/2013 [facility] Patient Information 2015 [place], [place]. This information is not intended to replace advice given to you by your health care provider. Make sure you discuss any questions you have with your health care provider.

## 2021-12-24 LAB — HEMOGLOBIN A1C
Est. average glucose Bld gHb Est-mCnc: 94 mg/dL
Hgb A1c MFr Bld: 4.9 % (ref 4.8–5.6)

## 2021-12-24 LAB — GC/CHLAMYDIA PROBE AMP
Chlamydia trachomatis, NAA: NEGATIVE
Neisseria Gonorrhoeae by PCR: NEGATIVE

## 2021-12-25 LAB — CBC/D/PLT+RPR+RH+ABO+RUBIGG...
Antibody Screen: NEGATIVE
Basophils Absolute: 0 10*3/uL (ref 0.0–0.2)
Basos: 1 %
EOS (ABSOLUTE): 0.1 10*3/uL (ref 0.0–0.4)
Eos: 1 %
HCV Ab: <0.1 s/co ratio (ref 0.0–0.9)
HIV Screen 4th Generation wRfx: NONREACTIVE
Hematocrit: 34.8 % (ref 34.0–46.6)
Hemoglobin: 11.7 g/dL (ref 11.1–15.9)
Hepatitis B Surface Ag: NEGATIVE
Immature Grans (Abs): 0 10*3/uL (ref 0.0–0.1)
Immature Granulocytes: 0 %
Lymphocytes Absolute: 3.1 10*3/uL (ref 0.7–3.1)
Lymphs: 43 %
MCH: 29.2 pg (ref 26.6–33.0)
MCHC: 33.6 g/dL (ref 31.5–35.7)
MCV: 87 fL (ref 79–97)
Monocytes Absolute: 0.4 10*3/uL (ref 0.1–0.9)
Monocytes: 5 %
Neutrophils Absolute: 3.6 10*3/uL (ref 1.4–7.0)
Neutrophils: 50 %
Platelets: 316 10*3/uL (ref 150–450)
RBC: 4.01 x10E6/uL (ref 3.77–5.28)
RDW: 13.4 % (ref 11.7–15.4)
RPR Ser Ql: NONREACTIVE
Rh Factor: POSITIVE
Rubella Antibodies, IGG: 2.32 index (ref >0.99)
WBC: 7.2 10*3/uL (ref 3.4–10.8)

## 2021-12-25 LAB — INTEGRATED 1
Crown Rump Length: 64.8 mm
Gest. Age on Collection Date: 12.7 weeks
Maternal Age at EDD: 26.3 yr
Nuchal Translucency (NT): 1.3 mm
Number of Fetuses: 1
PAPP-A Value: 1053.9 ng/mL
Weight: 165 [lb_av]

## 2021-12-25 LAB — URINE CULTURE

## 2021-12-25 LAB — HCV INTERPRETATION

## 2022-01-05 ENCOUNTER — Encounter: Payer: Self-pay | Admitting: *Deleted

## 2022-01-05 ENCOUNTER — Encounter: Payer: Self-pay | Admitting: Advanced Practice Midwife

## 2022-01-05 ENCOUNTER — Encounter: Payer: Self-pay | Admitting: Women's Health

## 2022-01-05 DIAGNOSIS — O285 Abnormal chromosomal and genetic finding on antenatal screening of mother: Secondary | ICD-10-CM | POA: Insufficient documentation

## 2022-01-20 ENCOUNTER — Encounter: Payer: Self-pay | Admitting: Obstetrics & Gynecology

## 2022-01-20 ENCOUNTER — Ambulatory Visit (INDEPENDENT_AMBULATORY_CARE_PROVIDER_SITE_OTHER): Payer: Medicaid Other | Admitting: Obstetrics & Gynecology

## 2022-01-20 ENCOUNTER — Other Ambulatory Visit: Payer: Self-pay

## 2022-01-20 VITALS — BP 111/83 | HR 117 | Wt 165.6 lb

## 2022-01-20 DIAGNOSIS — Z3482 Encounter for supervision of other normal pregnancy, second trimester: Secondary | ICD-10-CM

## 2022-01-20 DIAGNOSIS — Z3A16 16 weeks gestation of pregnancy: Secondary | ICD-10-CM

## 2022-01-20 DIAGNOSIS — Z1379 Encounter for other screening for genetic and chromosomal anomalies: Secondary | ICD-10-CM

## 2022-01-20 NOTE — Progress Notes (Signed)
° °  LOW-RISK PREGNANCY VISIT Patient name: Stephanie Hodges MRN [id number]  Date of birth: 07/11/1996 Chief Complaint:   Routine Prenatal Visit  History of Present Illness:   Enedina Pair is a 26 y.o. G87P0101 female at [redacted]w[redacted]d with an Estimated Date of Delivery: 07/04/22 being seen today for ongoing management of a low-risk pregnancy.   Depression screen [facility] 2/9 12/23/2021 01/11/2019 12/07/2018 10/24/2017  Decreased Interest 0 0 - 0  Down, Depressed, Hopeless 0 0 1 3  PHQ - 2 Score 0 0 1 3  Altered sleeping 0 - - 1  Tired, decreased energy 1 - - 1  Change in appetite 0 - - 2  Feeling bad or failure about yourself  0 - - 1  Trouble concentrating 0 - - 2  Moving slowly or fidgety/restless 0 - - 1  Suicidal thoughts 0 - - 0  PHQ-9 Score 1 - - 11  Difficult doing work/chores - - - Very difficult    Today she reports no complaints. Contractions: Not present. Vag. Bleeding: None.   Early gestation- not yet feeling fetal movement. denies leaking of fluid. Review of Systems:   Pertinent items are noted in HPI Denies abnormal vaginal discharge w/ itching/odor/irritation, headaches, visual changes, shortness of breath, chest pain, abdominal pain, severe nausea/vomiting, or problems with urination or bowel movements unless otherwise stated above. Pertinent History Reviewed:  Reviewed past medical,surgical, social, obstetrical and family history.  Reviewed problem list, medications and allergies.  Physical Assessment:   Vitals:   01/20/22 1514  BP: 111/83  Pulse: (!) 117  Weight: 165 lb 9.6 oz (75.1 kg)  Body mass index is 27.56 kg/m.        Physical Examination:   General appearance: Well appearing, and in no distress  Mental status: Alert, oriented to person, place, and time  Skin: Warm & dry  Respiratory: Normal respiratory effort, no distress  Abdomen: Soft, gravid, nontender  Pelvic: Cervical exam deferred         Extremities: Edema: None  Psych:  mood and affect appropriate  Fetal  Status: Fetal Heart Rate (bpm): 160        Chaperone: n/a    No results found for this or any previous visit (from the past 24 hour(s)).   Assessment & Plan:  1) Low-risk pregnancy G2P0101 at [redacted]w[redacted]d with an Estimated Date of Delivery: 07/04/22   - declined Makena  Meds: No orders of the defined types were placed in this encounter.  Labs/procedures today: IT-2  Plan:  Continue routine obstetrical care  Next visit: prefers in person    Reviewed: Preterm labor symptoms and general obstetric precautions including but not limited to vaginal bleeding, contractions, leaking of fluid and fetal movement were reviewed in detail with the patient.  All questions were answered.   Follow-up: Return for LROB visit and anatomy scan.  Orders Placed This Encounter  Procedures   INTEGRATED 2    Janyth Pupa, DO Attending [place], [facility] for [employer], [place]

## 2022-01-22 LAB — INTEGRATED 2
AFP MoM: 0.72
Alpha-Fetoprotein: 26 ng/mL
Crown Rump Length: 64.8 mm
DIA MoM: 1.39
DIA Value: 197.7 pg/mL
Estriol, Unconjugated: 1.08 ng/mL
Gest. Age on Collection Date: 12.7 weeks
Gestational Age: 16.7 weeks
Maternal Age at EDD: 26.3 yr
Nuchal Translucency (NT): 1.3 mm
Nuchal Translucency MoM: 0.89
Number of Fetuses: 1
PAPP-A MoM: 1.13
PAPP-A Value: 1053.9 ng/mL
Test Results:: NEGATIVE
Weight: 165 [lb_av]
Weight: 175 [lb_av]
hCG MoM: 1.59
hCG Value: 48.2 IU/mL
uE3 MoM: 1.01

## 2022-02-17 ENCOUNTER — Other Ambulatory Visit: Payer: Self-pay | Admitting: Obstetrics & Gynecology

## 2022-02-17 DIAGNOSIS — Z363 Encounter for antenatal screening for malformations: Secondary | ICD-10-CM

## 2022-02-18 ENCOUNTER — Encounter: Payer: Self-pay | Admitting: Advanced Practice Midwife

## 2022-02-18 ENCOUNTER — Other Ambulatory Visit (HOSPITAL_COMMUNITY)
Admission: RE | Admit: 2022-02-18 | Discharge: 2022-02-18 | Disposition: A | Discharge status: Home / Self Care | Payer: Medicaid Other | Source: Ambulatory Visit | Attending: Advanced Practice Midwife | Admitting: Advanced Practice Midwife

## 2022-02-18 ENCOUNTER — Other Ambulatory Visit: Payer: Self-pay

## 2022-02-18 ENCOUNTER — Ambulatory Visit (INDEPENDENT_AMBULATORY_CARE_PROVIDER_SITE_OTHER): Payer: Medicaid Other | Admitting: Advanced Practice Midwife

## 2022-02-18 ENCOUNTER — Ambulatory Visit (INDEPENDENT_AMBULATORY_CARE_PROVIDER_SITE_OTHER): Payer: Medicaid Other

## 2022-02-18 VITALS — BP 106/73 | HR 94 | Wt 172.0 lb

## 2022-02-18 DIAGNOSIS — Z3480 Encounter for supervision of other normal pregnancy, unspecified trimester: Secondary | ICD-10-CM

## 2022-02-18 DIAGNOSIS — Z3A20 20 weeks gestation of pregnancy: Secondary | ICD-10-CM

## 2022-02-18 DIAGNOSIS — Z363 Encounter for antenatal screening for malformations: Secondary | ICD-10-CM | POA: Diagnosis not present

## 2022-02-18 DIAGNOSIS — Z124 Encounter for screening for malignant neoplasm of cervix: Secondary | ICD-10-CM | POA: Insufficient documentation

## 2022-02-18 NOTE — Progress Notes (Signed)
? ?  LOW-RISK PREGNANCY VISIT ?Patient name: Stephanie Hodges MRN [id number]  Date of birth: 1996/02/12 ?Chief Complaint:   ?Routine Prenatal Visit ([place] & PAP today) ? ?History of Present Illness:   ?Stephanie Hodges is a 26 y.o. G26P0101 female at 41w4dwith an Estimated Date of Delivery: 07/04/22 being seen today for ongoing management of a low-risk pregnancy.  ?Today she reports no complaints. Contractions: Not present. Vag. Bleeding: None.  Movement: Present. denies leaking of fluid. ?Review of Systems:   ?Pertinent items are noted in HPI ?Denies abnormal vaginal discharge w/ itching/odor/irritation, headaches, visual changes, shortness of breath, chest pain, abdominal pain, severe nausea/vomiting, or problems with urination or bowel movements unless otherwise stated above. ?Pertinent History Reviewed:  ?Reviewed past medical,surgical, social, obstetrical and family history.  ?Reviewed problem list, medications and allergies. ?Physical Assessment:  ? ?Vitals:  ? 02/18/22 0921  ?BP: 106/73  ?Pulse: 94  ?Weight: 172 lb (78 kg)  ?Body mass index is 28.62 kg/m?. ?  ?     Physical Examination:  ? General appearance: Well appearing, and in no distress ? Mental status: Alert, oriented to person, place, and time ? Skin: Warm & dry ? Cardiovascular: Normal heart rate noted ? Respiratory: Normal respiratory effort, no distress ? Abdomen: Soft, gravid, nontender ? Pelvic: normal appearing. Pap collected        ? Extremities: Edema: Trace ? ?Fetal Status: Fetal Heart Rate (bpm): u[place]  Movement: Present  [place]227+5wks,cephalic,posterior placenta gr 0,normal ovaries,cx 4.5 cm,SVP of fluid 5.9 cm,FHR 159 bpm,LVEICF 1.3 mm,EFW 381 g 59%,anatomy complete ? ?Chaperone: JLevy Pupa  ? ?No results found for this or any previous visit (from the past 24 hour(s)).  ?Assessment & Plan:  ?1) Low-risk pregnancy G2P0101 at 260w4dith an Estimated Date of Delivery: 07/04/22  ? ?  ?Meds: No orders of the defined types were placed in this  encounter. ? ?Labs/procedures today: pap ? ?Plan:  Continue routine obstetrical care  ?Next visit: prefers in person   ? ?Reviewed: Preterm labor symptoms and general obstetric precautions including but not limited to vaginal bleeding, contractions, leaking of fluid and fetal movement were reviewed in detail with the patient.  All questions were answered. Has home bp cuff. Check bp weekly, let usKoreanow if >140/90.  ? ?Follow-up: Return in about 4 weeks (around 03/18/2022) for LRWeeki [place]? ?No orders of the defined types were placed in this encounter. ? ?FrChristin FudgeNP, CNM ?02/18/2022 ?10:13 AM ? ?

## 2022-02-18 NOTE — Patient Instructions (Addendum)
Darrall Dears, I greatly value your feedback.  If you receive a survey following your visit with [place] today, we appreciate you taking the time to fill it out.  ?Thanks, ?Nigel Berthold, CNM ? ? ? ? ?[place]!!! ?It is now [facility] [place] at [facility] ?([address] [place], [place] [address]) ?Entrance located off of [address] ?Free 24/7 valet parking  ? ?Go to [email or web address] to register for FREE online childbirth classes  ? ? ?Second Trimester of Pregnancy ?The second trimester is from week 14 through week 27 (months 4 through 6). The second trimester is often a time when you feel your best. Your body has adjusted to being pregnant, and you begin to feel better physically. Usually, morning sickness has lessened or quit completely, you may have more energy, and you may have an increase in appetite. The second trimester is also a time when the fetus is growing rapidly. At the end of the sixth month, the fetus is about 9 inches long and weighs about 1? pounds. You will likely begin to feel the baby move (quickening) between 16 and 20 weeks of pregnancy. ?Body changes during your second trimester ?Your body continues to go through many changes during your second trimester. The changes vary from woman to woman. ?Your weight will continue to increase. You will notice your lower abdomen bulging out. ?You may begin to get stretch marks on your hips, abdomen, and breasts. ?You may develop headaches that can be relieved by medicines. The medicines should be approved by your health care provider. ?You may urinate more often because the fetus is pressing on your bladder. ?You may develop or continue to have heartburn as a result of your pregnancy. ?You may develop constipation because certain hormones are causing the muscles that push waste through your intestines to slow down. ?You may develop hemorrhoids or swollen, bulging veins (varicose veins). ?You may have back pain. This is  caused by: ?Weight gain. ?Pregnancy hormones that are relaxing the joints in your pelvis. ?A shift in weight and the muscles that support your balance. ?Your breasts will continue to grow and they will continue to become tender. ?Your gums may bleed and may be sensitive to brushing and flossing. ?Dark spots or blotches (chloasma, mask of pregnancy) may develop on your face. This will likely fade after the baby is born. ?A dark line from your belly button to the pubic area (linea nigra) may appear. This will likely fade after the baby is born. ?You may have changes in your hair. These can include thickening of your hair, rapid growth, and changes in texture. Some women also have hair loss during or after pregnancy, or hair that feels dry or thin. Your hair will most likely return to normal after your baby is born. ? ?What to expect at prenatal visits ?During a routine prenatal visit: ?You will be weighed to make sure you and the fetus are growing normally. ?Your blood pressure will be taken. ?Your abdomen will be measured to track your baby's growth. ?The fetal heartbeat will be listened to. ?Any test results from the previous visit will be discussed. ? ?Your health care provider may ask you: ?How you are feeling. ?If you are feeling the baby move. ?If you have had any abnormal symptoms, such as leaking fluid, bleeding, severe headaches, or abdominal cramping. ?If you are using any tobacco products, including cigarettes, chewing tobacco, and electronic cigarettes. ?If you have any questions. ? ?Other tests that  may be performed during your second trimester include: ?Blood tests that check for: ?Low iron levels (anemia). ?High blood sugar that affects pregnant women (gestational diabetes) between 38 and 28 weeks. ?Rh antibodies. This is to check for a protein on red blood cells (Rh factor). ?Urine tests to check for infections, diabetes, or protein in the urine. ?An ultrasound to confirm the proper growth and  development of the baby. ?An amniocentesis to check for possible genetic problems. ?Fetal screens for spina bifida and Down syndrome. ?HIV (human immunodeficiency virus) testing. Routine prenatal testing includes screening for HIV, unless you choose not to have this test. ? ?Follow these instructions at home: ?Medicines ?Follow your health care provider's instructions regarding medicine use. Specific medicines may be either safe or unsafe to take during pregnancy. ?Take a prenatal vitamin that contains at least 600 micrograms (mcg) of folic acid. ?If you develop constipation, try taking a stool softener if your health care provider approves. ?Eating and drinking ?Eat a balanced diet that includes fresh fruits and vegetables, whole grains, good sources of protein such as meat, eggs, or tofu, and low-fat dairy. Your health care provider will help you determine the amount of weight gain that is right for you. ?Avoid raw meat and uncooked cheese. These carry germs that can cause birth defects in the baby. ?If you have low calcium intake from food, talk to your health care provider about whether you should take a daily calcium supplement. ?Limit foods that are high in fat and processed sugars, such as fried and sweet foods. ?To prevent constipation: ?Drink enough fluid to keep your urine clear or pale yellow. ?Eat foods that are high in fiber, such as fresh fruits and vegetables, whole grains, and beans. ?Activity ?Exercise only as directed by your health care provider. Most women can continue their usual exercise routine during pregnancy. Try to exercise for 30 minutes at least 5 days a week. Stop exercising if you experience uterine contractions. ?Avoid heavy lifting, wear low heel shoes, and practice good posture. ?A sexual relationship may be continued unless your health care provider directs you otherwise. ?Relieving pain and discomfort ?Wear a good support bra to prevent discomfort from breast tenderness. ?Take  warm sitz baths to soothe any pain or discomfort caused by hemorrhoids. Use hemorrhoid cream if your health care provider approves. ?Rest with your legs elevated if you have leg cramps or low back pain. ?If you develop varicose veins, wear support hose. Elevate your feet for 15 minutes, 3-4 times a day. Limit salt in your diet. ?Prenatal Care ?Write down your questions. Take them to your prenatal visits. ?Keep all your prenatal visits as told by your health care provider. This is important. ?Safety ?Wear your seat belt at all times when driving. ?Make a list of emergency phone numbers, including numbers for family, friends, the hospital, and police and fire departments. ?General instructions ?Ask your health care provider for a referral to a local prenatal education class. Begin classes no later than the beginning of month 6 of your pregnancy. ?Ask for help if you have counseling or nutritional needs during pregnancy. Your health care provider can offer advice or refer you to specialists for help with various needs. ?Do not use hot tubs, steam rooms, or saunas. ?Do not douche or use tampons or scented sanitary pads. ?Do not cross your legs for long periods of time. ?Avoid cat litter boxes and soil used by cats. These carry germs that can cause birth defects in the baby and  possibly loss of the fetus by miscarriage or stillbirth. ?Avoid all smoking, herbs, alcohol, and unprescribed drugs. Chemicals in these products can affect the formation and growth of the baby. ?Do not use any products that contain nicotine or tobacco, such as cigarettes and e-cigarettes. If you need help quitting, ask your health care provider. ?Visit your dentist if you have not gone yet during your pregnancy. Use a soft toothbrush to brush your teeth and be gentle when you floss. ?Contact a health care provider if: ?You have dizziness. ?You have mild pelvic cramps, pelvic pressure, or nagging pain in the abdominal area. ?You have persistent  nausea, vomiting, or diarrhea. ?You have a bad smelling vaginal discharge. ?You have pain when you urinate. ?Get help right away if: ?You have a fever. ?You are leaking fluid from your vagina. ?You have spotting or

## 2022-02-18 NOTE — Progress Notes (Signed)
[place] 00+9 wks,cephalic,posterior placenta gr 0,normal ovaries,cx 4.5 cm,SVP of fluid 5.9 cm,FHR 159 bpm,LVEICF 1.3 mm,EFW 381 g 59%,anatomy complete ?

## 2022-02-23 LAB — CYTOLOGY - PAP
Comment: NEGATIVE
Comment: NEGATIVE
Comment: NEGATIVE
Diagnosis: HIGH — AB
HPV 16: POSITIVE — AB
HPV 18 / 45: NEGATIVE
High risk HPV: POSITIVE — AB

## 2022-02-24 ENCOUNTER — Encounter: Payer: Self-pay | Admitting: Advanced Practice Midwife

## 2022-03-01 ENCOUNTER — Encounter: Payer: Self-pay | Admitting: Obstetrics & Gynecology

## 2022-03-01 ENCOUNTER — Ambulatory Visit (INDEPENDENT_AMBULATORY_CARE_PROVIDER_SITE_OTHER): Payer: Medicaid Other | Admitting: Obstetrics & Gynecology

## 2022-03-01 VITALS — BP 116/71 | HR 101 | Wt 174.4 lb

## 2022-03-01 DIAGNOSIS — N766 Ulceration of vulva: Secondary | ICD-10-CM

## 2022-03-01 DIAGNOSIS — Z3480 Encounter for supervision of other normal pregnancy, unspecified trimester: Secondary | ICD-10-CM

## 2022-03-01 DIAGNOSIS — R87611 Atypical squamous cells cannot exclude high grade squamous intraepithelial lesion on cytologic smear of cervix (ASC-H): Secondary | ICD-10-CM | POA: Diagnosis not present

## 2022-03-01 NOTE — Addendum Note (Signed)
Addended by: Octaviano Glow on: 03/01/2022 10:05 AM ? ? Modules accepted: Orders ? ?

## 2022-03-01 NOTE — Progress Notes (Signed)
? ? ?Colposcopy Procedure Note: ? ? ? ?Colposcopy Procedure Note ? ?Indications:  ?01/2022   ASC-H: +HPV 16 ?12/2018   normal ? ? ?2019 ASCCP recommendation: Colposcopy ? ?Smoker:  No. ?New sexual partner:  No.   ? ?History of abnormal Pap: no ? ?Procedure Details  ?The risks and benefits of the procedure and Written informed consent obtained. ? ?Speculum placed in vagina and excellent visualization of cervix achieved, cervix swabbed x 3 with acetic acid solution. ? ?Findings: ?Adequate colposcopy is noted today. ? ?Cervix: acetowhite lesion(s) noted at 6 o'clock; +punctation no mosaicism or abnormal vessels. ?Vaginal inspection: normal without visible lesions. ?Vulvar colposcopy: vulvar colposcopy not performed. ? ?Specimens: none ? ?Complications: none. ? 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 ?Plan(Based on 2019 ASCCP recommendations) ?Re evaluate 8-12 weeks post delivery ? ? ? ?Hodges-RISK PREGNANCY VISIT ?Patient name: Stephanie Hodges MRN [id number]  Date of birth: 01-10-1996 ?Chief Complaint:   ?Colposcopy and Routine Prenatal Visit ? ?History of Present Illness:   ?Stephanie Hodges is a 26 y.o. G75P0101 female at 80w1dwith an Estimated Date of Delivery: 07/04/22 being seen today for ongoing management of a Hodges-risk pregnancy.  ? ?  12/23/2021  ?  2:50 PM 01/11/2019  ? 10:43 AM 12/07/2018  ?  9:27 AM 10/24/2017  ?  3:14 PM  ?Depression screen PHQ 2/9  ?Decreased Interest 0 0  0  ?Down, Depressed, Hopeless 0 0 1 3  ?PHQ - 2 Score 0 0 1 3  ?Altered sleeping 0   1  ?Tired, decreased energy 1   1  ?Change in appetite 0   2  ?Feeling bad or failure about yourself  0   1  ?Trouble concentrating 0   2  ?Moving slowly or fidgety/restless 0   1  ?Suicidal thoughts 0   0  ?PHQ-9 Score 1   11  ?Difficult doing work/chores    Very difficult  ? ? ?Today she reports no complaints. Contractions: Not present.  .  Movement: Present. denies leaking of fluid. ?Review of Systems:   ?Pertinent items are noted in HPI ?Denies abnormal  vaginal discharge w/ itching/odor/irritation, headaches, visual changes, shortness of breath, chest pain, abdominal pain, severe nausea/vomiting, or problems with urination or bowel movements unless otherwise stated above. ?Pertinent History Reviewed:  ?Reviewed past medical,surgical, social, obstetrical and family history.  ?Reviewed problem list, medications and allergies. ?Physical Assessment:  ? ?Vitals:  ? 03/01/22 0900  ?BP: 116/71  ?Pulse: (!) 101  ?Weight: 174 lb 6.4 oz (79.1 kg)  ?Body mass index is 29.02 kg/m?. ?  ?     Physical Examination:  ? General appearance: Well appearing, and in no distress ? Mental status: Alert, oriented to person, place, and time ? Skin: Warm & dry ? Cardiovascular: Normal heart rate noted ? Respiratory: Normal respiratory effort, no distress ? Abdomen: Soft, gravid, nontender ? Pelvic: see colpo note: ulcerative lesion on the right vulva, ?HSV 1, denies previous similar lesion, no real discomfort , RPR was negativw       ? Extremities: Edema: None ? ?Fetal Status:     Movement: Present   ? ?Chaperone: LAlice Rieger  ? ?No results found for this or any previous visit (from the past 24 hour(s)).  ?Assessment & Plan:  ?1) Hodges-risk pregnancy G2P0101 at 280w1dith an Estimated Date of Delivery: 07/04/22  ? ?2) Right vulvar lesion, ulcerative, HSV test done,  ? ?  ICD-10-CM   ?1. Supervision of other normal pregnancy, antepartum  Z34.80   ?  ?2. Vulvar ulceration, right sided  N76.6   ? HSV test performed  ?  ?3. Atypical squamous cells cannot exclude high grade squamous intraepithelial lesion on cytologic smear of cervix (ASC-H)  R87.611   ? colpsocopy with AWE at 6 o'clock, recommend re evaluation 8-12 weeks postpartum  ?  ? ? ?  ?Meds: No orders of the defined types were placed in this encounter. ? ?Labs/procedures today: colposcopy ? ?Plan:  Continue routine obstetrical care  ?Next visit: prefers in person   ? ?Reviewed: Preterm labor symptoms and general obstetric precautions  including but not limited to vaginal bleeding, contractions, leaking of fluid and fetal movement were reviewed in detail with the patient.  All questions were answered. Has home bp cuff. Rx faxed to . Check bp weekly, let us know if >140/90.  ? ?Follow-up: Return in about 4 weeks (around 03/29/2022) for [place]. ?Delay PN2 til 28 weeks ? ?No orders of the defined types were placed in this encounter. ? ? ?Florian Buff, MD ?03/01/2022 ?9:43 AM ? ? ? ?

## 2022-03-04 ENCOUNTER — Encounter: Payer: Self-pay | Admitting: Obstetrics & Gynecology

## 2022-03-04 LAB — HERPES SIMPLEX VIRUS CULTURE

## 2022-03-17 ENCOUNTER — Encounter: Payer: Self-pay | Admitting: Obstetrics & Gynecology

## 2022-03-17 ENCOUNTER — Ambulatory Visit (INDEPENDENT_AMBULATORY_CARE_PROVIDER_SITE_OTHER): Payer: Medicaid Other | Admitting: Medical

## 2022-03-17 ENCOUNTER — Encounter: Payer: Self-pay | Admitting: Medical

## 2022-03-17 VITALS — BP 119/77 | HR 84 | Wt 172.0 lb

## 2022-03-17 DIAGNOSIS — O283 Abnormal ultrasonic finding on antenatal screening of mother: Secondary | ICD-10-CM | POA: Insufficient documentation

## 2022-03-17 DIAGNOSIS — Z3480 Encounter for supervision of other normal pregnancy, unspecified trimester: Secondary | ICD-10-CM

## 2022-03-17 DIAGNOSIS — Z3A24 24 weeks gestation of pregnancy: Secondary | ICD-10-CM

## 2022-03-17 DIAGNOSIS — O285 Abnormal chromosomal and genetic finding on antenatal screening of mother: Secondary | ICD-10-CM

## 2022-03-17 DIAGNOSIS — R87618 Other abnormal cytological findings on specimens from cervix uteri: Secondary | ICD-10-CM

## 2022-03-17 NOTE — Progress Notes (Signed)
? ?  PRENATAL VISIT NOTE ? ?Subjective:  ?Stephanie Hodges is a 26 y.o. G2P0101 at 42w3dbeing seen today for ongoing prenatal care.  She is currently monitored for the following issues for this low-risk pregnancy and has Mass of upper outer quadrant of left breast; Anxiety and depression; Abnormal Papanicolaou smear of cervix with positive human papilloma virus (HPV) test; Tubular adenoma of left female breast; Encounter for supervision of normal pregnancy, antepartum; and Abnormal chromosomal and genetic finding on antenatal screening mother on their problem list. ? ?Patient reports no complaints.  Contractions: Not present. Vag. Bleeding: None.  Movement: Present. Denies leaking of fluid.  ? ?The following portions of the patient's history were reviewed and updated as appropriate: allergies, current medications, past family history, past medical history, past social history, past surgical history and problem list.  ? ?Objective:  ? ?Vitals:  ? 03/17/22 1120  ?BP: 119/77  ?Pulse: 84  ?Weight: 172 lb (78 kg)  ? ? ?Fetal Status: Fetal Heart Rate (bpm): 151 Fundal Height: 26 cm Movement: Present    ? ?General:  Alert, oriented and cooperative. Patient is in no acute distress.  ?Skin: Skin is warm and dry. No rash noted.   ?Cardiovascular: Normal heart rate noted  ?Respiratory: Normal respiratory effort, no problems with respiration noted  ?Abdomen: Soft, gravid, appropriate for gestational age.  Pain/Pressure: Absent     ?Pelvic: Cervical exam deferred        ?Extremities: Normal range of motion.  Edema: None  ?Mental Status: Normal mood and affect. Normal behavior. Normal judgment and thought content.  ? ?Assessment and Plan:  ?Pregnancy: G2P0101 at 257w3d1. Supervision of other normal pregnancy, antepartum ?- Planning to breastfeed ?- Desires contraception, mode unsure ?- Desires circumcision for baby inpatient ?- Peds list given  ?- anticipatory guidance for 28 week labs and TDAP discussed for next visit   ?-  Reviewed USKoreano further USKoreaecommended due to isolated LVEICF ? ?2. Abnormal chromosomal and genetic finding on antenatal screening mother ? ?3. Abnormal Papanicolaou smear of cervix with positive human papilloma virus (HPV) test ?- Repeat pap smear PP  ? ?4. [redacted] weeks gestation of pregnancy ? ?Preterm labor symptoms and general obstetric precautions including but not limited to vaginal bleeding, contractions, leaking of fluid and fetal movement were reviewed in detail with the patient. ?Please refer to After Visit Summary for other counseling recommendations.  ? ?Return in about 4 weeks (around 04/14/2022) for LOB, 28 week labs (fasting), In-Person. ? ?Future Appointments  ?Date Time Provider [place]?04/14/2022  8:30 AM [facility]  ?04/14/2022  9:10 AM OzJanyth PupaDO [facility]  ? ? ?JuKerry HoughPA-C ? ?

## 2022-03-17 NOTE — Patient Instructions (Addendum)
AREA PEDIATRIC/FAMILY PRACTICE PHYSICIANS ? ?[place] ([id number]) ?[facility] ?Chambliss, MD; Eniola, MD; Hale, MD; Hensel, MD; McDiarmid, MD; McIntyer, MD; Neal, MD; Walden, MD ?[address] [place], [place] [address] ?[phone] ?Mon-Fri 8:30-12:30, 1:30-5:00 ?Providers come to see babies at [facility] ?Accepting Medicaid ?[facility] ?Limited providers who accept newborns: Koirala, MD; Morrow, MD; Wolters, MD ?[address], [place], [place] [address] ?[phone] ?Mon-Fri 8:00-5:30 ?Babies seen by providers at [facility] ?Does NOT accept Medicaid ?Please call early in hospitalization for appointment (limited availability)  ?[facility] ?Mulberry, MD ?[address] [place], [place] [address] ?[phone] ?Mon, Tue, Thur, Fri 8:30-5:00, Wed 10:00-7:00 (closed 1-2pm) ?Babies seen by [facility] providers ?Accepting Medicaid ?[facility] ?Rubin, MD ?[address], [place], [place] [address] ?[phone] ?Mon-Fri 8:30-5:00, Sat 8:30-12:00 ?Provider comes to see babies at [facility] ?Accepting Medicaid ?Must have been referred from current patients or contacted office prior to delivery ?[facility] ([facility]) ?Brown, MD; Chandler, MD; Ettefagh, MD; Grant, MD; Lester, MD; McCormick, MD; McQueen, MD; Prose, MD; Simha, MD; Stanley, MD; Stryffeler, NP; Tebben, NP ?[address], [place], [place] [address] ?[phone] ?Mon, Tue, Thur, Fri 8:30-5:30, Wed 9:30-5:30, Sat 8:30-12:30 ?Babies seen by [facility] providers ?Accepting Medicaid ?Only accepting infants of first-time parents or siblings of current patients ?Hospital discharge coordinator will make follow-up appointment ?Jack Amos ?[address], [place], [place]  [address] ?[phone]   Fax - [phone] ?[facility] ?[address], [place], [place]  [address] ?Phone - [phone]   Fax - [phone] ?Shilpa Gosrani ?[address], [place], [place]  [address] ?[phone] ? ?[place] ([address]) ?[facility] ?Bates, MD; Brassfield, MD; Cooper, Cox, MD; MD; Davis, MD; Dovico, MD; Ettefaugh, MD; Little, MD; Lowe, MD; Keiffer, MD; Melvin, MD; Sumner, MD; Williams, MD ?[address], [place], [place] [address] ?[phone] ?Mon-Fri 8:30-5:00 (extended evenings Mon-Thur as needed), Sat-Sun 10:00-1:00 ?Providers come to see babies at [facility] ?Accepting Medicaid for families of first-time babies and families with all children in the household age 3 and under. Must register with office prior to making appointment (M-F only). ?[facility] ?Henson, NP; Knapp, MD; Lalonde, MD; Tysinger, PA ?[address] [place], [place] [address] ?[phone] ?Mon-Fri 8:00-5:00 ?Babies seen by providers at [facility] ?Does NOT accept Medicaid/Commercial Insurance Only ?[facility] ([facility])  ?Artis, MD; Barnes, MD; Bratton, MD; Coccaro, MD; Lockett Gardner, MD; Kramer, MD; Marshall, MD; Netherton, MD; Poleto, MD; Skinner, MD ?[address] [place], [place] [address] ?[phone] ?Mon-Fri 8:30-5:30, Sat (Oct.-Mar.) 9:00-1:00 ?Babies seen by providers at [facility] ?Accepting Medicaid ? ?[place] ([address]) ?[facility] ?Reid, MD; Warner, MD ?[address], [place], [place] [address] ?[phone] ?Mon-Fri 8:30-5:00, Sat 8:30-12:00 ?Providers come to see babies at [facility] ?Does NOT accept Medicaid ?[facility] ?Becker, PA; Hagler, MD; Scifres, PA; Sun, MD; Swayne, MD ?[address], [place], [place] [address] ?[phone] ?Mon-Fri 8:00-5:00 ?Babies seen by providers at [facility] ?Does NOT accept Medicaid ?Only accepting babies of parents who  are patients ?Please call early in hospitalization for appointment (limited availability) ?[facility] ?Clark, MD; Frye, MD; Kelleher, MD; Mack, NP; Salih, MD; O'Keller, MD; Patterson, NP; Pudlo, MD; Puzio, MD; Thomas, MD; Tucker, MD; Twiselton, MD ?[address], [place], [place] [address] ?[phone] ?Mon-Fri 8:00-5:00, Sat 9:00-12:00 ?Providers come to see babies at [facility] ?Does NOT accept Medicaid ? ?[place] ([id number]) ?[facility] ?Limited providers accepting new patients: Brake, NP; Wharton, PA ?[address], [place], [place] [address] ?[phone] ?Mon-Fri 8:00-5:00 ?Babies seen by providers at [facility] ?Does NOT accept Medicaid ?Only accepting babies of parents who are patients ?Please call early in hospitalization for appointment (limited availability) ?[facility] ?Gay, MD; Quinlan, MD ?[address] [place], [place] [address] ?[phone] (press [phone] to schedule appointment) ?Mon-Fri 8:00-5:00 ?Providers come to see babies at [facility] ?Does NOT accept Medicaid ?[facility] ?Mazer, MD ?[address] [place], [place] [address] ?[phone] ?Mon-Fri 8:30-5:00 (lunch 12:30-1:00), extended hours by appointment only Wed 5:00-6:30 ?Babies seen by [facility] providers ?Accepting Medicaid ?[facility] ?Banks, MD; Jordan, MD; Koberlein, MD ?[address], [place], [place] [address] ?[phone] ?Mon-Fri 8:00-5:00 ?Babies seen by [facility] providers ?Does NOT accept Medicaid ?[facility] ?Parker, MD; Hunter, MD; Wallace, DO ?[address] [place], [place] [address] ?[phone] ?Mon-Fri 8:00-5:00 ?Babies seen by [facility] providers ?Does NOT accept Medicaid ?[facility] ?Brandon, PA; Brecken, PA; Christy, NP; Dees, MD; DeClaire, MD; DeWeese, MD; Hansen, NP; Mills, NP; Parrish, NP; Smoot, NP; Summer, MD; Vapne,  MD ?[address] [place], [place] [address] ?[phone] ?Mon-Fri 8:30-5:00, Sat 10:00-1:00 ?Providers come to see babies at [facility] ?Does NOT accept Medicaid ?Free prenatal information session Tuesdays at 4:45pm ?[facility] ?Bouska, MD; Gordon, PA; Jeffery, PA; Weber, PA ?[address] [place] [address] ?[phone] ?Mon-Fri 7:30-5:30 ?Babies seen by [facility] providers ?[facility] ?[address], [place], [place]  [address] ?[phone]   Fax - [phone] ? ?[place] ([address]) ?[facility] ?Reese, MD ?[address] [place], [place] [address] ?[phone] ?Mon-Thur 8:00-6:00 ?Providers come to see babies at [facility] ?Accepting Medicaid ?[facility] ?Anderson, NP; Badger, MD; Beal, PA; Spencer, PA ?[address] [place], [place] [address] ?[phone] ?Mon-Thur 7:30-7:30, Fri 7:30-4:30 ?Babies seen by [facility] providers ?Accepting Medicaid ?[facility] ?Agbuya, MD; Klett, NP; Romgoolam, MD ?[address], [place], [place] [address] ?[phone] ?Mon-Fri 8:30-5:00, Sat 8:30-12:00 ?Providers come to see babies at [facility] ?Accepting Medicaid ?Must have ?Meet & Greet? appointment at office prior to delivery ?[facility] ([facility]) ?McCord, MD; Wallace, MD; Wood, MD ?[address], [place], [place] [address] ?[phone] ?Mon-Wed 8:00-6:00, Thur-Fri 8:00-5:00, Sat 9:00-12:00 ?Providers come to see babies at [facility] ?Does NOT accept Medicaid ?Only accepting siblings of current patients ?[facility]  ?[address], [place], [place]  [address] ?[phone]   Fax - [phone] ?[facility] ?[address], [place], [place]  [address] ?[phone]   Fax -  [phone] ? ?[place]  ([address]) ?[facility] ?Cirigliano, DO; Matthews, DO ?[address] ,  [address] ?[phone] ?Mon-Fri 7:00-5:00 ?Babies seen by [place]

## 2022-03-18 ENCOUNTER — Encounter: Payer: Medicaid Other | Admitting: Advanced Practice Midwife

## 2022-04-14 ENCOUNTER — Other Ambulatory Visit: Payer: Medicaid Other

## 2022-04-14 ENCOUNTER — Encounter: Payer: Self-pay | Admitting: Obstetrics & Gynecology

## 2022-04-14 ENCOUNTER — Ambulatory Visit (INDEPENDENT_AMBULATORY_CARE_PROVIDER_SITE_OTHER): Payer: Medicaid Other | Admitting: Obstetrics & Gynecology

## 2022-04-14 VITALS — BP 112/69 | HR 98 | Wt 168.4 lb

## 2022-04-14 DIAGNOSIS — Z3A28 28 weeks gestation of pregnancy: Secondary | ICD-10-CM

## 2022-04-14 DIAGNOSIS — Z3483 Encounter for supervision of other normal pregnancy, third trimester: Secondary | ICD-10-CM

## 2022-04-14 DIAGNOSIS — Z131 Encounter for screening for diabetes mellitus: Secondary | ICD-10-CM

## 2022-04-14 DIAGNOSIS — Z3480 Encounter for supervision of other normal pregnancy, unspecified trimester: Secondary | ICD-10-CM

## 2022-04-14 NOTE — Progress Notes (Signed)
? ?  LOW-RISK PREGNANCY VISIT ?Patient name: Stephanie Hodges MRN [id number]  Date of birth: 07-31-1996 ?Chief Complaint:   ?Routine Prenatal Visit (PN2 today) ? ?History of Present Illness:   ?Stephanie Hodges is a 26 y.o. G76P0101 female at 49w3dwith an Estimated Date of Delivery: 07/04/22 being seen today for ongoing management of a low-risk pregnancy.  ? ? ?  04/14/2022  ?  9:10 AM 12/23/2021  ?  2:50 PM 01/11/2019  ? 10:43 AM 12/07/2018  ?  9:27 AM 10/24/2017  ?  3:14 PM  ?Depression screen PHQ 2/9  ?Decreased Interest 0 0 0  0  ?Down, Depressed, Hopeless 0 0 0 1 3  ?PHQ - 2 Score 0 0 0 1 3  ?Altered sleeping 0 0   1  ?Tired, decreased energy 0 1   1  ?Change in appetite 0 0   2  ?Feeling bad or failure about yourself  0 0   1  ?Trouble concentrating 0 0   2  ?Moving slowly or fidgety/restless 0 0   1  ?Suicidal thoughts 0 0   0  ?PHQ-9 Score 0 1   11  ?Difficult doing work/chores     Very difficult  ? ? ?Today she reports no complaints. Contractions: Not present. Vag. Bleeding: None.  Movement: Present. denies leaking of fluid. ?Review of Systems:   ?Pertinent items are noted in HPI ?Denies abnormal vaginal discharge w/ itching/odor/irritation, headaches, visual changes, shortness of breath, chest pain, abdominal pain, severe nausea/vomiting, or problems with urination or bowel movements unless otherwise stated above. ?Pertinent History Reviewed:  ?Reviewed past medical,surgical, social, obstetrical and family history.  ?Reviewed problem list, medications and allergies. ? ?Physical Assessment:  ? ?Vitals:  ? 04/14/22 0908  ?BP: 112/69  ?Pulse: 98  ?Weight: 168 lb 6.4 oz (76.4 kg)  ?Body mass index is 28.02 kg/m?. ?  ?     Physical Examination:  ? General appearance: Well appearing, and in no distress ? Mental status: Alert, oriented to person, place, and time ? Skin: Warm & dry ? Respiratory: Normal respiratory effort, no distress ? Abdomen: Soft, gravid, nontender ? Pelvic: Cervical exam deferred        ? Extremities:  Edema: Trace ? Psych:  mood and affect appropriate ? ?Fetal Status: Fetal Heart Rate (bpm): 145 Fundal Height: 27 cm Movement: Present   ? ?Chaperone: n/a   ? ?No results found for this or any previous visit (from the past 24 hour(s)).  ? ?Assessment & Plan:  ?1) Low-risk pregnancy G2P0101 at 276w3dith an Estimated Date of Delivery: 07/04/22  ? ?'[]'$  Tdap next visit ?  ?Meds: No orders of the defined types were placed in this encounter. ? ?Labs/procedures today: PN2 today,  ?Plan:  Continue routine obstetrical care  ?Next visit: prefers in person   ? ?Reviewed: Preterm labor symptoms and general obstetric precautions including but not limited to vaginal bleeding, contractions, leaking of fluid and fetal movement were reviewed in detail with the patient.  All questions were answered. ? ?Follow-up: Return in about 2 weeks (around 04/28/2022) for LRSterlingisit. ? ?JeJanyth PupaDO ?Attending [facility] ?[facility] ? ? ?

## 2022-04-15 LAB — GLUCOSE TOLERANCE, 2 HOURS W/ 1HR
Glucose, 1 hour: 152 mg/dL (ref 70–179)
Glucose, 2 hour: 95 mg/dL (ref 70–152)
Glucose, Fasting: 84 mg/dL (ref 70–91)

## 2022-04-15 LAB — CBC
Hematocrit: 32.7 % — ABNORMAL LOW (ref 34.0–46.6)
Hemoglobin: 11.4 g/dL (ref 11.1–15.9)
MCH: 29.8 pg (ref 26.6–33.0)
MCHC: 34.9 g/dL (ref 31.5–35.7)
MCV: 86 fL (ref 79–97)
Platelets: 194 10*3/uL (ref 150–450)
RBC: 3.82 x10E6/uL (ref 3.77–5.28)
RDW: 14.2 % (ref 11.7–15.4)
WBC: 5.5 10*3/uL (ref 3.4–10.8)

## 2022-04-15 LAB — ANTIBODY SCREEN: Antibody Screen: NEGATIVE

## 2022-04-15 LAB — RPR: RPR Ser Ql: NONREACTIVE

## 2022-04-15 LAB — HIV ANTIBODY (ROUTINE TESTING W REFLEX): HIV Screen 4th Generation wRfx: NONREACTIVE

## 2022-04-24 ENCOUNTER — Encounter: Payer: Self-pay | Admitting: Obstetrics & Gynecology

## 2022-04-28 ENCOUNTER — Ambulatory Visit (INDEPENDENT_AMBULATORY_CARE_PROVIDER_SITE_OTHER): Payer: Medicaid Other | Admitting: Advanced Practice Midwife

## 2022-04-28 ENCOUNTER — Encounter: Payer: Self-pay | Admitting: Advanced Practice Midwife

## 2022-04-28 VITALS — BP 102/70 | HR 104 | Wt 166.0 lb

## 2022-04-28 DIAGNOSIS — R87618 Other abnormal cytological findings on specimens from cervix uteri: Secondary | ICD-10-CM

## 2022-04-28 DIAGNOSIS — Z3480 Encounter for supervision of other normal pregnancy, unspecified trimester: Secondary | ICD-10-CM

## 2022-04-28 DIAGNOSIS — Z3A30 30 weeks gestation of pregnancy: Secondary | ICD-10-CM

## 2022-04-28 DIAGNOSIS — O09899 Supervision of other high risk pregnancies, unspecified trimester: Secondary | ICD-10-CM | POA: Insufficient documentation

## 2022-04-28 NOTE — Patient Instructions (Signed)
Stephanie Hodges, thank you for choosing our office today! We appreciate the opportunity to meet your healthcare needs. You may receive a short survey by mail, e-mail, or through [employer]. If you are happy with your care we would appreciate if you could take just a few minutes to complete the survey questions. We read all of your comments and take your feedback very seriously. Thank you again for choosing our office.  [facility] [employer] Team at [place] [facility] ([place], [place] [address]) Entrance C, located off of [address] parking   CLASSES: Go to [email or web address] to register for classes (childbirth, breastfeeding, waterbirth, infant CPR, daddy bootcamp, etc.)  Call the office [phone]) or go to [facility] if: You begin to have strong, frequent contractions Your water breaks.  Sometimes it is a big gush of fluid, sometimes it is just a trickle that keeps getting your panties wet or running down your legs You have vaginal bleeding.  It is normal to have a small amount of spotting if your cervix was checked.  You don't feel your baby moving like normal.  If you don't, get you something to eat and drink and lay down and focus on feeling your baby move.   If your baby is still not moving like normal, you should call the office or go to [facility].  Call the office [phone]) or go to [facility] hospital for these signs of pre-eclampsia: Severe headache that does not go away with Tylenol Visual changes- seeing spots, double, blurred vision Pain under your right breast or upper abdomen that does not go away with Tums or heartburn medicine Nausea and/or vomiting Severe swelling in your hands, feet, and face   Tdap Vaccine It is recommended that you get the Tdap vaccine during the third trimester of EACH pregnancy to help protect your baby from getting pertussis (whooping cough) 27-36 weeks is the BEST time to do  this so that you can pass the protection on to your baby. During pregnancy is better than after pregnancy, but if you are unable to get it during pregnancy it will be offered at the hospital.  You can get this vaccine with [place], at the health department, your family doctor, or some local pharmacies Everyone who will be around your baby should also be up-to-date on their vaccines before the baby comes. Adults (who are not pregnant) only need 1 dose of Tdap during adulthood.   [facility] Pediatricians/Family Doctors [facility]): [address] [place], [facility]: [address] [place], [phone]                [facility]): [place], [phone] (call to ask if accepting patients) [facility] Department: [address], [place], [place] Pediatricians/Family Doctors [facility]): [place], [address], [place] [facility]: [address] [place], [facility] [place]: [place], [place] [facility]): [phone] [facility]: [address] [place]: [address], [place] [facility]: [phone], [phone]  Home Blood Pressure Monitoring for Patients   Your provider has recommended that you check your  blood pressure (BP) at least once a week at home. If you do not have a blood pressure cuff at home, one will be provided for you. Contact your provider if you have not received your monitor within 1 week.   Helpful Tips for Accurate Home Blood Pressure Checks  Don't smoke, exercise, or drink caffeine 30 minutes before checking your BP Use the restroom before checking your BP (a full bladder can raise your  pressure) Relax in a comfortable upright chair Feet on the ground Left arm resting comfortably on a flat surface at the level of your heart Legs uncrossed Back supported Sit quietly and don't talk Place the cuff on your bare arm Adjust snuggly, so that only two fingertips can fit between your skin and the top of the cuff Check 2 readings separated by at least one minute Keep a log of your BP readings For a visual, please reference this diagram: http://ccnc.care/bpdiagram  Provider Name: [facility]     Phone: [phone]  Zone 1: ALL CLEAR  Continue to monitor your symptoms:  BP reading is less than 140 (top number) or less than 90 (bottom number)  No right upper stomach pain No headaches or seeing spots No feeling nauseated or throwing up No swelling in face and hands  Zone 2: CAUTION Call your doctor's office for any of the following:  BP reading is greater than 140 (top number) or greater than 90 (bottom number)  Stomach pain under your ribs in the middle or right side Headaches or seeing spots Feeling nauseated or throwing up Swelling in face and hands  Zone 3: EMERGENCY  Seek immediate medical care if you have any of the following:  BP reading is greater than160 (top number) or greater than 110 (bottom number) Severe headaches not improving with Tylenol Serious difficulty catching your breath Any worsening symptoms from Zone 2   Third Trimester of Pregnancy The third trimester is from week 29 through week 42, months 7 through 9. The third trimester is a time when the fetus is growing rapidly. At the end of the ninth month, the fetus is about 20 inches in length and weighs 6-10 pounds.  BODY CHANGES Your body goes through many changes during pregnancy. The changes vary from woman to woman.  Your weight will continue to increase. You can expect to gain 25-35 pounds (11-16 kg) by the end of the pregnancy. You may begin to get stretch marks on your hips, abdomen,  and breasts. You may urinate more often because the fetus is moving lower into your pelvis and pressing on your bladder. You may develop or continue to have heartburn as a result of your pregnancy. You may develop constipation because certain hormones are causing the muscles that push waste through your intestines to slow down. You may develop hemorrhoids or swollen, bulging veins (varicose veins). You may have pelvic pain because of the weight gain and pregnancy hormones relaxing your joints between the bones in your pelvis. Backaches may result from overexertion of the muscles supporting your posture. You may have changes in your hair. These can include thickening of your hair, rapid growth, and changes in texture. Some women also have hair loss during or after pregnancy, or hair that feels dry or thin. Your hair will most likely return to normal after your baby is born. Your breasts will continue to grow and be tender. A yellow discharge may leak from your breasts called colostrum. Your belly button may stick out. You may   feel short of breath because of your expanding uterus. You may notice the fetus "dropping," or moving lower in your abdomen. You may have a bloody mucus discharge. This usually occurs a few days to a week before labor begins. Your cervix becomes thin and soft (effaced) near your due date. WHAT TO EXPECT AT YOUR PRENATAL EXAMS  You will have prenatal exams every 2 weeks until week 36. Then, you will have weekly prenatal exams. During a routine prenatal visit: You will be weighed to make sure you and the fetus are growing normally. Your blood pressure is taken. Your abdomen will be measured to track your baby's growth. The fetal heartbeat will be listened to. Any test results from the previous visit will be discussed. You may have a cervical check near your due date to see if you have effaced. At around 36 weeks, your caregiver will check your cervix. At the same time, your  caregiver will also perform a test on the secretions of the vaginal tissue. This test is to determine if a type of bacteria, Group B streptococcus, is present. Your caregiver will explain this further. Your caregiver may ask you: What your birth plan is. How you are feeling. If you are feeling the baby move. If you have had any abnormal symptoms, such as leaking fluid, bleeding, severe headaches, or abdominal cramping. If you have any questions. Other tests or screenings that may be performed during your third trimester include: Blood tests that check for low iron levels (anemia). Fetal testing to check the health, activity level, and growth of the fetus. Testing is done if you have certain medical conditions or if there are problems during the pregnancy. FALSE LABOR You may feel small, irregular contractions that eventually go away. These are called Braxton Hicks contractions, or false labor. Contractions may last for hours, days, or even weeks before true labor sets in. If contractions come at regular intervals, intensify, or become painful, it is best to be seen by your caregiver.  SIGNS OF LABOR  Menstrual-like cramps. Contractions that are 5 minutes apart or less. Contractions that start on the top of the uterus and spread down to the lower abdomen and back. A sense of increased pelvic pressure or back pain. A watery or bloody mucus discharge that comes from the vagina. If you have any of these signs before the 37th week of pregnancy, call your caregiver right away. You need to go to the hospital to get checked immediately. HOME CARE INSTRUCTIONS  Avoid all smoking, herbs, alcohol, and unprescribed drugs. These chemicals affect the formation and growth of the baby. Follow your caregiver's instructions regarding medicine use. There are medicines that are either safe or unsafe to take during pregnancy. Exercise only as directed by your caregiver. Experiencing uterine cramps is a good sign to  stop exercising. Continue to eat regular, healthy meals. Wear a good support bra for breast tenderness. Do not use hot tubs, steam rooms, or saunas. Wear your seat belt at all times when driving. Avoid raw meat, uncooked cheese, cat litter boxes, and soil used by cats. These carry germs that can cause birth defects in the baby. Take your prenatal vitamins. Try taking a stool softener (if your caregiver approves) if you develop constipation. Eat more high-fiber foods, such as fresh vegetables or fruit and whole grains. Drink plenty of fluids to keep your urine clear or pale yellow. Take warm sitz baths to soothe any pain or discomfort caused by hemorrhoids. Use hemorrhoid cream if   your caregiver approves. If you develop varicose veins, wear support hose. Elevate your feet for 15 minutes, 3-4 times a day. Limit salt in your diet. Avoid heavy lifting, wear low heal shoes, and practice good posture. Rest a lot with your legs elevated if you have leg cramps or low back pain. Visit your dentist if you have not gone during your pregnancy. Use a soft toothbrush to brush your teeth and be gentle when you floss. A sexual relationship may be continued unless your caregiver directs you otherwise. Do not travel far distances unless it is absolutely necessary and only with the approval of your caregiver. Take prenatal classes to understand, practice, and ask questions about the labor and delivery. Make a trial run to the hospital. Pack your hospital bag. Prepare the baby's nursery. Continue to go to all your prenatal visits as directed by your caregiver. SEEK MEDICAL CARE IF: You are unsure if you are in labor or if your water has broken. You have dizziness. You have mild pelvic cramps, pelvic pressure, or nagging pain in your abdominal area. You have persistent nausea, vomiting, or diarrhea. You have a bad smelling vaginal discharge. You have pain with urination. SEEK IMMEDIATE MEDICAL CARE IF:  You  have a fever. You are leaking fluid from your vagina. You have spotting or bleeding from your vagina. You have severe abdominal cramping or pain. You have rapid weight loss or gain. You have shortness of breath with chest pain. You notice sudden or extreme swelling of your face, hands, ankles, feet, or legs. You have not felt your baby move in over an hour. You have severe headaches that do not go away with medicine. You have vision changes. Document Released: 11/09/2001 Document Revised: 11/20/2013 Document Reviewed: 01/16/2013 [facility] Patient Information 2015 [place], [place]. This information is not intended to replace advice given to you by your health care provider. Make sure you discuss any questions you have with your health care provider.

## 2022-04-28 NOTE — Progress Notes (Signed)
   LOW-RISK PREGNANCY VISIT Patient name: Stephanie Hodges MRN [id number]  Date of birth: Jun 21, 1996 Chief Complaint:   Routine Prenatal Visit  History of Present Illness:   Stephanie Hodges is a 26 y.o. G63P0101 female at 32w3dwith an Estimated Date of Delivery: 07/04/22 being seen today for ongoing management of a low-risk pregnancy.  Today she reports no complaints. Contractions: Irritability. Vag. Bleeding: None.  Movement: Present. denies leaking of fluid. Review of Systems:   Pertinent items are noted in HPI Denies abnormal vaginal discharge w/ itching/odor/irritation, headaches, visual changes, shortness of breath, chest pain, abdominal pain, severe nausea/vomiting, or problems with urination or bowel movements unless otherwise stated above. Pertinent History Reviewed:  Reviewed past medical,surgical, social, obstetrical and family history.  Reviewed problem list, medications and allergies. Physical Assessment:   Vitals:   04/28/22 0940  BP: 102/70  Pulse: (!) 104  Weight: 166 lb (75.3 kg)  Body mass index is 27.62 kg/m.        Physical Examination:   General appearance: Well appearing, and in no distress  Mental status: Alert, oriented to person, place, and time  Skin: Warm & dry  Cardiovascular: Normal heart rate noted  Respiratory: Normal respiratory effort, no distress  Abdomen: Soft, gravid, nontender  Pelvic: Cervical exam deferred         Extremities: Edema: Trace  Fetal Status: Fetal Heart Rate (bpm): 135 Fundal Height: 30 cm Movement: Present    No results found for this or any previous visit (from the past 24 hour(s)).  Assessment & Plan:  1) Low-risk pregnancy G2P0101 at 320w3dith an Estimated Date of Delivery: 07/04/22   2) Low maternal weight gain, total of 4lbs but feels she is eating adequately; BMI 27 and fundal height nl; will continue to watch   Meds: No orders of the defined types were placed in this encounter.  Labs/procedures today: none  Plan:   Continue routine obstetrical care   Reviewed: Preterm labor symptoms and general obstetric precautions including but not limited to vaginal bleeding, contractions, leaking of fluid and fetal movement were reviewed in detail with the patient.  All questions were answered. Didn't ask about home bp cuff. Check bp weekly, let usKoreanow if >140/90.   Follow-up: Return in about 2 weeks (around 05/12/2022) for LRMeridianin person.  No orders of the defined types were placed in this encounter.  KiMyrtis SerNM 04/28/2022 10:02 AM

## 2022-05-14 ENCOUNTER — Encounter: Payer: Self-pay | Admitting: Obstetrics & Gynecology

## 2022-05-14 ENCOUNTER — Ambulatory Visit (INDEPENDENT_AMBULATORY_CARE_PROVIDER_SITE_OTHER): Payer: Medicaid Other | Admitting: Obstetrics & Gynecology

## 2022-05-14 VITALS — BP 134/76 | HR 86 | Wt 168.0 lb

## 2022-05-14 DIAGNOSIS — Z3480 Encounter for supervision of other normal pregnancy, unspecified trimester: Secondary | ICD-10-CM

## 2022-05-14 NOTE — Progress Notes (Signed)
   LOW-RISK PREGNANCY VISIT Patient name: Stephanie Hodges MRN [id number]  Date of birth: [birth year] Chief Complaint:   Routine Prenatal Visit  History of Present Illness:   Stephanie Hodges is a 26 y.o. G32P0101 female at 22w5dwith an Estimated Date of Delivery: 07/04/22 being seen today for ongoing management of a low-risk pregnancy.     04/14/2022    9:10 AM 12/23/2021    2:50 PM 01/11/2019   10:43 AM 12/07/2018    9:27 AM 10/24/2017    3:14 PM  Depression screen PHQ 2/9  Decreased Interest 0 0 0  0  Down, Depressed, Hopeless 0 0 0 1 3  PHQ - 2 Score 0 0 0 1 3  Altered sleeping 0 0   1  Tired, decreased energy 0 1   1  Change in appetite 0 0   2  Feeling bad or failure about yourself  0 0   1  Trouble concentrating 0 0   2  Moving slowly or fidgety/restless 0 0   1  Suicidal thoughts 0 0   0  PHQ-9 Score 0 1   11  Difficult doing work/chores     Very difficult    Today she reports no complaints. Contractions: Not present. Vag. Bleeding: None.  Movement: Present. denies leaking of fluid. Review of Systems:   Pertinent items are noted in HPI Denies abnormal vaginal discharge w/ itching/odor/irritation, headaches, visual changes, shortness of breath, chest pain, abdominal pain, severe nausea/vomiting, or problems with urination or bowel movements unless otherwise stated above. Pertinent History Reviewed:  Reviewed past medical,surgical, social, obstetrical and family history.  Reviewed problem list, medications and allergies. Physical Assessment:   Vitals:   05/14/22 0942  BP: 134/76  Pulse: 86  Weight: 168 lb (76.2 kg)  Body mass index is 27.96 kg/m.        Physical Examination:   General appearance: Well appearing, and in no distress  Mental status: Alert, oriented to person, place, and time  Skin: Warm & dry  Cardiovascular: Normal heart rate noted  Respiratory: Normal respiratory effort, no distress  Abdomen: Soft, gravid, nontender  Pelvic: Cervical exam deferred          Extremities: Edema: Trace  Fetal Status: Fetal Heart Rate (bpm): 138 Fundal Height: 32 cm Movement: Present    Chaperone: n/a    No results found for this or any previous visit (from the past 24 hour(s)).  Assessment & Plan:  1) Low-risk pregnancy G2P0101 at 360w5dith an Estimated Date of Delivery: 07/04/22      Meds: No orders of the defined types were placed in this encounter.  Labs/procedures today:   Plan:  Continue routine obstetrical care  Next visit: prefers in person    Reviewed: Preterm labor symptoms and general obstetric precautions including but not limited to vaginal bleeding, contractions, leaking of fluid and fetal movement were reviewed in detail with the patient.  All questions were answered. Has home bp cuff. Rx faxed to . Check bp weekly, let usKoreanow if >140/90.   Follow-up: Return in about 2 weeks (around 05/28/2022) for LRSavonburg No orders of the defined types were placed in this encounter.   LuFlorian BuffMD 05/14/2022 10:02 AM

## 2022-05-28 ENCOUNTER — Encounter: Payer: Self-pay | Admitting: Obstetrics & Gynecology

## 2022-05-28 ENCOUNTER — Ambulatory Visit (INDEPENDENT_AMBULATORY_CARE_PROVIDER_SITE_OTHER): Payer: Medicaid Other | Admitting: Obstetrics & Gynecology

## 2022-05-28 VITALS — BP 123/81 | HR 85 | Wt 169.8 lb

## 2022-05-28 DIAGNOSIS — Z3403 Encounter for supervision of normal first pregnancy, third trimester: Secondary | ICD-10-CM

## 2022-05-28 NOTE — Progress Notes (Signed)
   LOW-RISK PREGNANCY VISIT Patient name: Stephanie Hodges MRN [id number]  Date of birth: 1996/03/21 Chief Complaint:   Routine Prenatal Visit  History of Present Illness:   Stephanie Hodges is a 26 y.o. G10P0101 female at 45w5dwith an Estimated Date of Delivery: 07/04/22 being seen today for ongoing management of a low-risk pregnancy.     04/14/2022    9:10 AM 12/23/2021    2:50 PM 01/11/2019   10:43 AM 12/07/2018    9:27 AM 10/24/2017    3:14 PM  Depression screen PHQ 2/9  Decreased Interest 0 0 0  0  Down, Depressed, Hopeless 0 0 0 1 3  PHQ - 2 Score 0 0 0 1 3  Altered sleeping 0 0   1  Tired, decreased energy 0 1   1  Change in appetite 0 0   2  Feeling bad or failure about yourself  0 0   1  Trouble concentrating 0 0   2  Moving slowly or fidgety/restless 0 0   1  Suicidal thoughts 0 0   0  PHQ-9 Score 0 1   11  Difficult doing work/chores     Very difficult    Today she reports occasional contractions. Contractions: Irritability. Vag. Bleeding: None.  Movement: Present. denies leaking of fluid. Review of Systems:   Pertinent items are noted in HPI Denies abnormal vaginal discharge w/ itching/odor/irritation, headaches, visual changes, shortness of breath, chest pain, abdominal pain, severe nausea/vomiting, or problems with urination or bowel movements unless otherwise stated above. Pertinent History Reviewed:  Reviewed past medical,surgical, social, obstetrical and family history.  Reviewed problem list, medications and allergies.  Physical Assessment:   Vitals:   05/28/22 0858 05/28/22 0900  BP: 139/79 123/81  Pulse: 85   Weight: 169 lb 12.8 oz (77 kg)   Body mass index is 28.26 kg/m.        Physical Examination:   General appearance: Well appearing, and in no distress  Mental status: Alert, oriented to person, place, and time  Skin: Warm & dry  Respiratory: Normal respiratory effort, no distress  Abdomen: Soft, gravid, nontender  Pelvic: Cervical exam deferred          Extremities: Edema: None  Psych:  mood and affect appropriate  Fetal Status: Fetal Heart Rate (bpm): 145 Fundal Height: 34 cm Movement: Present    Chaperone: n/a    No results found for this or any previous visit (from the past 24 hour(s)).   Assessment & Plan:  1) Low-risk pregnancy G2P0101 at 366w5dith an Estimated Date of Delivery: 07/04/22   '[]'$  GBS next visit Previously delivered @ 35wk   Meds: No orders of the defined types were placed in this encounter.  Labs/procedures today: none  Plan:  Continue routine obstetrical care  Next visit: prefers in person    Reviewed: Preterm labor symptoms and general obstetric precautions including but not limited to vaginal bleeding, contractions, leaking of fluid and fetal movement were reviewed in detail with the patient.  All questions were answered.   Follow-up: Return in about 1 week (around 06/04/2022) for LRLyerlyisit.  No orders of the defined types were placed in this encounter.   JeJanyth PupaDO Attending ObHarperFaAlliance Healthcare Systemor WoDean Foods CompanyCoHamp[place]

## 2022-06-03 ENCOUNTER — Other Ambulatory Visit (HOSPITAL_COMMUNITY)
Admission: RE | Admit: 2022-06-03 | Discharge: 2022-06-03 | Disposition: A | Discharge status: Home / Self Care | Payer: Medicaid Other | Source: Ambulatory Visit | Attending: Advanced Practice Midwife | Admitting: Advanced Practice Midwife

## 2022-06-03 ENCOUNTER — Ambulatory Visit (INDEPENDENT_AMBULATORY_CARE_PROVIDER_SITE_OTHER): Payer: Medicaid Other | Admitting: Advanced Practice Midwife

## 2022-06-03 ENCOUNTER — Encounter: Payer: Self-pay | Admitting: Advanced Practice Midwife

## 2022-06-03 VITALS — BP 129/87 | HR 61 | Wt 174.0 lb

## 2022-06-03 DIAGNOSIS — N760 Acute vaginitis: Secondary | ICD-10-CM | POA: Insufficient documentation

## 2022-06-03 DIAGNOSIS — Z3480 Encounter for supervision of other normal pregnancy, unspecified trimester: Secondary | ICD-10-CM | POA: Diagnosis not present

## 2022-06-03 DIAGNOSIS — Z3A35 35 weeks gestation of pregnancy: Secondary | ICD-10-CM

## 2022-06-03 LAB — POCT URINALYSIS DIPSTICK OB
Blood, UA: NEGATIVE
Glucose, UA: NEGATIVE
Ketones, UA: NEGATIVE
Leukocytes, UA: NEGATIVE
Nitrite, UA: NEGATIVE

## 2022-06-03 MED ORDER — METRONIDAZOLE 500 MG PO TABS: 500.0000 mg | ORAL_TABLET | Freq: Two times a day (BID) | ORAL | 0 refills | Status: DC

## 2022-06-03 NOTE — Progress Notes (Signed)
LOW-RISK PREGNANCY VISIT Patient name: Stephanie Hodges MRN [id number]  Date of birth: [birth year] Chief Complaint:   Routine Prenatal Visit  History of Present Illness:   Stephanie Hodges is a 26 y.o. G24P0101 female at 71w4dwith an Estimated Date of Delivery: 07/04/22 being seen today for ongoing management of a low-risk pregnancy.  Today she reports being anxious because she delivered around this time with her first baby.  Has noticed her underware gets damp a lot.  C/O "feeling uncomfortable"  in vagina and buttocks. Contractions: Irritability. Vag. Bleeding: None.  Movement: Present. denies leaking of fluid. Review of Systems:   Pertinent items are noted in HPI Denies abnormal vaginal discharge w/ itching/odor/irritation, headaches, visual changes, shortness of breath, chest pain, abdominal pain, severe nausea/vomiting, or problems with urination or bowel movements unless otherwise stated above. Pertinent History Reviewed:  Reviewed past medical,surgical, social, obstetrical and family history.  Reviewed problem list, medications and allergies. Physical Assessment:   Vitals:   06/03/22 1518 06/03/22 1529  BP: (!) 138/92 129/87  Pulse: 82 61  Weight: 78.9 kg   Body mass index is 28.96 kg/m.        Physical Examination:   General appearance: Well appearing, and in no distress  Mental status: Alert, oriented to person, place, and time  Skin: Warm & dry  Cardiovascular: Normal heart rate noted  Respiratory: Normal respiratory effort, no distress  Abdomen: Soft, gravid, nontender  Pelvic: Cervical exam performed  Dilation: Fingertip Effacement (%): Thick Station: -3  thin white frothy DC w/amine odor.  Wet prep + clue cells and WBCs.  Cultures collected  Extremities: Edema: None  Fetal Status: Fetal Heart Rate (bpm): 152   Movement: Present Presentation: Vertex  Chaperone: Amanda Rash    Results for orders placed or performed in visit on 06/03/22 (from the past 24 hour(s))  POC  Urinalysis Dipstick OB   Collection Time: 06/03/22  3:23 PM  Result Value Ref Range   Color, UA     Clarity, UA     Glucose, UA Negative Negative   Bilirubin, UA     Ketones, UA neg    Spec Grav, UA     Blood, UA neg    pH, UA     POC,PROTEIN,UA Trace Negative, Trace, Small (1+), Moderate (2+), Large (3+), 4+   Urobilinogen, UA     Nitrite, UA neg    Leukocytes, UA Negative Negative   Appearance     Odor      Assessment & Plan:  1) Low-risk pregnancy G2P0101 at 318w4dith an Estimated Date of Delivery: 07/04/22   2) BV, rx flagyl   Meds:  Meds ordered this encounter  Medications   metroNIDAZOLE (FLAGYL) 500 MG tablet    Sig: Take 1 tablet (500 mg total) by mouth 2 (two) times daily.    Dispense:  14 tablet    Refill:  0    Order Specific Question:   Supervising Provider    Answer:   EUTania Ade [[id number]]   Labs/procedures today: Wet prep, GC, CHL.  Plan:  Continue routine obstetrical care  Next visit: prefers in person    Reviewed: Preterm labor symptoms and general obstetric precautions including but not limited to vaginal bleeding, contractions, leaking of fluid and fetal movement were reviewed in detail with the patient.  All questions were answered. GHas home bp cuff. . Check bp weekly, let usKoreanow if >140/90.   Follow-up: Return for weekly LROB X 4.  Orders Placed  This Encounter  Procedures   Culture, beta strep (group b only)   POC Urinalysis Dipstick OB   Christin Fudge DNP, CNM 06/03/2022 7:13 PM

## 2022-06-03 NOTE — Patient Instructions (Signed)

## 2022-06-04 ENCOUNTER — Encounter: Payer: Medicaid Other | Admitting: Advanced Practice Midwife

## 2022-06-07 LAB — CERVICOVAGINAL ANCILLARY ONLY
Bacterial Vaginitis (gardnerella): POSITIVE — AB
Candida Glabrata: NEGATIVE
Candida Vaginitis: NEGATIVE
Chlamydia: NEGATIVE
Comment: NEGATIVE
Comment: NEGATIVE
Comment: NEGATIVE
Comment: NEGATIVE
Comment: NEGATIVE
Comment: NORMAL
Neisseria Gonorrhea: NEGATIVE
Trichomonas: NEGATIVE

## 2022-06-07 LAB — CULTURE, BETA STREP (GROUP B ONLY): Strep Gp B Culture: NEGATIVE

## 2022-06-10 ENCOUNTER — Ambulatory Visit (INDEPENDENT_AMBULATORY_CARE_PROVIDER_SITE_OTHER): Payer: Medicaid Other | Admitting: Obstetrics & Gynecology

## 2022-06-10 ENCOUNTER — Encounter: Payer: Self-pay | Admitting: Obstetrics & Gynecology

## 2022-06-10 ENCOUNTER — Encounter: Payer: Medicaid Other | Admitting: Obstetrics & Gynecology

## 2022-06-10 VITALS — BP 144/101 | HR 102 | Wt 172.0 lb

## 2022-06-10 DIAGNOSIS — Z3480 Encounter for supervision of other normal pregnancy, unspecified trimester: Secondary | ICD-10-CM

## 2022-06-10 DIAGNOSIS — O133 Gestational [pregnancy-induced] hypertension without significant proteinuria, third trimester: Secondary | ICD-10-CM

## 2022-06-10 DIAGNOSIS — Z3A36 36 weeks gestation of pregnancy: Secondary | ICD-10-CM

## 2022-06-10 LAB — POCT URINALYSIS DIPSTICK OB
Blood, UA: NEGATIVE
Glucose, UA: NEGATIVE
Leukocytes, UA: NEGATIVE
Nitrite, UA: NEGATIVE

## 2022-06-10 NOTE — Progress Notes (Signed)
   Induction Assessment Scheduling Form: Fax to [facility]:  [phone]  Stephanie Hodges                                                                                   DOB:  November 14, 1996                                                            MRN:  [id number]                                                                     Phone #:   [phone]                         Provider:  [facility]  GP:  [id number]                                                            Estimated Date of Delivery: 07/04/22  Dating Criteria: early sonogram    Medical Indications for induction:  Gestational hypertension Admission Date/Time:  06/14/22 am Gestational age on admission:  [redacted]w[redacted]d  Filed Weights   06/10/22 1506  Weight: 172 lb (78 kg)   HIV:  Non Reactive (05/17 0840) GBS: Negative/-- (07/06 1640)  Cervix unsure   Method of induction(proposed):  cytotec/foley/pitocin   Scheduling Provider Signature:  LFlorian Buff MD                                            Today's Date:  06/10/2022

## 2022-06-10 NOTE — Progress Notes (Signed)
HIGH-RISK PREGNANCY VISIT Patient name: Stephanie Hodges MRN [id number]  Date of birth: [birth year] Chief Complaint:   Routine Prenatal Visit  History of Present Illness:   Stephanie Hodges is a 26 y.o. G63P0101 female at 68w4dwith an Estimated Date of Delivery: 07/04/22 being seen today for ongoing management of a high-risk pregnancy complicated by gestational hypertension.    Today she reports no complaints. Contractions: Irritability. Vag. Bleeding: None.  Movement: Present. denies leaking of fluid.      04/14/2022    9:10 AM 12/23/2021    2:50 PM 01/11/2019   10:43 AM 12/07/2018    9:27 AM 10/24/2017    3:14 PM  Depression screen PHQ 2/9  Decreased Interest 0 0 0  0  Down, Depressed, Hopeless 0 0 0 1 3  PHQ - 2 Score 0 0 0 1 3  Altered sleeping 0 0   1  Tired, decreased energy 0 1   1  Change in appetite 0 0   2  Feeling bad or failure about yourself  0 0   1  Trouble concentrating 0 0   2  Moving slowly or fidgety/restless 0 0   1  Suicidal thoughts 0 0   0  PHQ-9 Score 0 1   11  Difficult doing work/chores     Very difficult        04/14/2022    9:10 AM 12/23/2021    2:50 PM  GAD 7 : Generalized Anxiety Score  Nervous, Anxious, on Edge 0 0  Control/stop worrying 0 0  Worry too much - different things 0 0  Trouble relaxing 0 0  Restless 0 0  Easily annoyed or irritable 0 0  Afraid - awful might happen 0 0  Total GAD 7 Score 0 0     Review of Systems:   Pertinent items are noted in HPI Denies abnormal vaginal discharge w/ itching/odor/irritation, headaches, visual changes, shortness of breath, chest pain, abdominal pain, severe nausea/vomiting, or problems with urination or bowel movements unless otherwise stated above. Pertinent History Reviewed:  Reviewed past medical,surgical, social, obstetrical and family history.  Reviewed problem list, medications and allergies. Physical Assessment:   Vitals:   06/10/22 1506 06/10/22 1511  BP: (!) 144/95 (!) 144/101  Pulse:  (!) 108 (!) 102  Weight: 172 lb (78 kg)   Body mass index is 28.62 kg/m.           Physical Examination:   General appearance: alert, well appearing, and in no distress  Mental status: alert, oriented to person, place, and time  Skin: warm & dry   Extremities: Edema: None    Cardiovascular: normal heart rate noted  Respiratory: normal respiratory effort, no distress  Abdomen: gravid, soft, non-tender  Pelvic: Cervical exam deferred         Fetal Status:     Movement: Present    Fetal Surveillance Testing today: 150   Chaperone: N/A    Results for orders placed or performed in visit on 06/10/22 (from the past 24 hour(s))  POC Urinalysis Dipstick OB   Collection Time: 06/10/22  3:16 PM  Result Value Ref Range   Color, UA     Clarity, UA     Glucose, UA Negative Negative   Bilirubin, UA     Ketones, UA trace    Spec Grav, UA     Blood, UA neg    pH, UA     POC,PROTEIN,UA Small (1+) Negative, Trace, Small (1+), Moderate (  2+), Large (3+), 4+   Urobilinogen, UA     Nitrite, UA neg    Leukocytes, UA Negative Negative   Appearance     Odor      Assessment & Plan:  High-risk pregnancy: G2P0101 at 78w4dwith an Estimated Date of Delivery: 07/04/22      ICD-10-CM   1. Supervision of other normal pregnancy, antepartum  Z34.80 POC Urinalysis Dipstick OB    2. Gestational hypertension, third trimester  O13.3    IOL 37 weeks    3. [redacted] weeks gestation of pregnancy  Z3A.36 POC Urinalysis Dipstick OB        Meds: No orders of the defined types were placed in this encounter.   Orders:  Orders Placed This Encounter  Procedures   POC Urinalysis Dipstick OB     Labs/procedures today:  Pr/Cr ratio  Treatment Plan:  IOL 06/14/22 for gestational hypertension, pending Pr/Cr   Follow-up: Return in about 18 days (around 06/28/2022) for BP check, Nurse only.   Future Appointments  Date Time Provider [place] 06/17/2022  9:10 AM OJanyth Pupa DO [facility]   06/24/2022  8:30 AM CChristin Fudge CNM [facility]  07/02/2022  8:50 AM SMyrtis Ser CNM [facility]    Orders Placed This Encounter  Procedures   POC Urinalysis Dipstick OB   LFlorian Buff Attending Physician [facility] 06/10/2022 3:54 PM

## 2022-06-11 ENCOUNTER — Encounter: Payer: Self-pay | Admitting: Obstetrics & Gynecology

## 2022-06-11 LAB — PROTEIN / CREATININE RATIO, URINE
Creatinine, Urine: 349.1 mg/dL
Protein, Ur: 48.9 mg/dL
Protein/Creat Ratio: 140 mg/g creat (ref 0–200)

## 2022-06-14 ENCOUNTER — Encounter (HOSPITAL_COMMUNITY): Payer: Self-pay | Admitting: Obstetrics & Gynecology

## 2022-06-14 ENCOUNTER — Encounter: Payer: Self-pay | Admitting: Obstetrics & Gynecology

## 2022-06-14 ENCOUNTER — Other Ambulatory Visit: Payer: Self-pay

## 2022-06-14 ENCOUNTER — Inpatient Hospital Stay (HOSPITAL_COMMUNITY)
Admission: AD | Admit: 2022-06-14 | Discharge: 2022-06-17 | DRG: 807 | Disposition: A | Discharge status: Home / Self Care | Payer: Medicaid Other | Attending: Obstetrics and Gynecology | Admitting: Obstetrics and Gynecology

## 2022-06-14 DIAGNOSIS — O1414 Severe pre-eclampsia complicating childbirth: Principal | ICD-10-CM | POA: Diagnosis present

## 2022-06-14 DIAGNOSIS — Z30014 Encounter for initial prescription of intrauterine contraceptive device: Secondary | ICD-10-CM | POA: Diagnosis not present

## 2022-06-14 DIAGNOSIS — Z30430 Encounter for insertion of intrauterine contraceptive device: Secondary | ICD-10-CM | POA: Diagnosis not present

## 2022-06-14 DIAGNOSIS — O133 Gestational [pregnancy-induced] hypertension without significant proteinuria, third trimester: Secondary | ICD-10-CM | POA: Diagnosis present

## 2022-06-14 DIAGNOSIS — Z3A37 37 weeks gestation of pregnancy: Secondary | ICD-10-CM

## 2022-06-14 DIAGNOSIS — Z3490 Encounter for supervision of normal pregnancy, unspecified, unspecified trimester: Secondary | ICD-10-CM

## 2022-06-14 DIAGNOSIS — O134 Gestational [pregnancy-induced] hypertension without significant proteinuria, complicating childbirth: Secondary | ICD-10-CM | POA: Diagnosis present

## 2022-06-14 DIAGNOSIS — Z87891 Personal history of nicotine dependence: Secondary | ICD-10-CM

## 2022-06-14 DIAGNOSIS — O283 Abnormal ultrasonic finding on antenatal screening of mother: Secondary | ICD-10-CM | POA: Diagnosis present

## 2022-06-14 DIAGNOSIS — R87618 Other abnormal cytological findings on specimens from cervix uteri: Secondary | ICD-10-CM | POA: Diagnosis present

## 2022-06-14 DIAGNOSIS — Z7982 Long term (current) use of aspirin: Secondary | ICD-10-CM | POA: Diagnosis not present

## 2022-06-14 HISTORY — DX: Gestational (pregnancy-induced) hypertension without significant proteinuria, unspecified trimester: O13.9

## 2022-06-14 LAB — COMPREHENSIVE METABOLIC PANEL
ALT: 18 U/L (ref 0–44)
AST: 25 U/L (ref 15–41)
Albumin: 2.6 g/dL — ABNORMAL LOW (ref 3.5–5.0)
Alkaline Phosphatase: 201 U/L — ABNORMAL HIGH (ref 38–126)
Anion gap: 9 (ref 5–15)
BUN: 6 mg/dL (ref 6–20)
CO2: 20 mmol/L — ABNORMAL LOW (ref 22–32)
Calcium: 8.6 mg/dL — ABNORMAL LOW (ref 8.9–10.3)
Chloride: 108 mmol/L (ref 98–111)
Creatinine, Ser: 0.93 mg/dL (ref 0.44–1.00)
GFR, Estimated: >60 mL/min (ref >60)
Glucose, Bld: 79 mg/dL (ref 70–99)
Potassium: 3.6 mmol/L (ref 3.5–5.1)
Sodium: 137 mmol/L (ref 135–145)
Total Bilirubin: 1.1 mg/dL (ref 0.3–1.2)
Total Protein: 6.1 g/dL — ABNORMAL LOW (ref 6.5–8.1)

## 2022-06-14 LAB — TYPE AND SCREEN
ABO/RH(D): A POS
Antibody Screen: NEGATIVE

## 2022-06-14 LAB — CBC
HCT: 34.1 % — ABNORMAL LOW (ref 36.0–46.0)
Hemoglobin: 11.8 g/dL — ABNORMAL LOW (ref 12.0–15.0)
MCH: 29.2 pg (ref 26.0–34.0)
MCHC: 34.6 g/dL (ref 30.0–36.0)
MCV: 84.4 fL (ref 80.0–100.0)
Platelets: 137 10*3/uL — ABNORMAL LOW (ref 150–400)
RBC: 4.04 MIL/uL (ref 3.87–5.11)
RDW: 14.7 % (ref 11.5–15.5)
WBC: 6.6 10*3/uL (ref 4.0–10.5)
nRBC: 0 % (ref 0.0–0.2)

## 2022-06-14 LAB — PROTEIN / CREATININE RATIO, URINE
Creatinine, Urine: 53 mg/dL
Total Protein, Urine: <6 mg/dL

## 2022-06-14 LAB — RAPID HIV SCREEN (HIV 1/2 AB+AG)
HIV 1/2 Antibodies: NONREACTIVE
HIV-1 P24 Antigen - HIV24: NONREACTIVE

## 2022-06-14 MED ORDER — OXYCODONE-ACETAMINOPHEN 5-325 MG PO TABS: 1.0000 | ORAL_TABLET | ORAL | Status: DC | PRN

## 2022-06-14 MED ORDER — MISOPROSTOL 25 MCG QUARTER TABLET
25.0000 ug | ORAL_TABLET | ORAL | Status: DC | PRN
Start: 2022-06-14 — End: 2022-06-14

## 2022-06-14 MED ORDER — ONDANSETRON HCL 4 MG/2ML IJ SOLN
4.0000 mg | Freq: Four times a day (QID) | INTRAMUSCULAR | Status: DC | PRN
  Administered 2022-06-15: 4 mg via INTRAVENOUS
  Filled 2022-06-14: qty 2

## 2022-06-14 MED ORDER — OXYTOCIN-SODIUM CHLORIDE 30-0.9 UT/500ML-% IV SOLN: 2.5000 [IU]/h | INTRAVENOUS | Status: DC

## 2022-06-14 MED ORDER — MISOPROSTOL 50MCG HALF TABLET
50.0000 ug | ORAL_TABLET | ORAL | Status: DC | PRN
  Administered 2022-06-14: 50 ug via BUCCAL
  Filled 2022-06-14 (×2): qty 1

## 2022-06-14 MED ORDER — OXYTOCIN-SODIUM CHLORIDE 30-0.9 UT/500ML-% IV SOLN
1.0000 m[IU]/min | INTRAVENOUS | Status: DC
  Administered 2022-06-15: 2 m[IU]/min via INTRAVENOUS
  Filled 2022-06-14: qty 500

## 2022-06-14 MED ORDER — OXYTOCIN BOLUS FROM INFUSION
333.0000 mL | Freq: Once | INTRAVENOUS | Status: AC
  Administered 2022-06-15: 333 mL via INTRAVENOUS

## 2022-06-14 MED ORDER — LACTATED RINGERS IV SOLN: INTRAVENOUS | Status: DC

## 2022-06-14 MED ORDER — TERBUTALINE SULFATE 1 MG/ML IJ SOLN: 0.2500 mg | Freq: Once | INTRAMUSCULAR | Status: DC | PRN

## 2022-06-14 MED ORDER — LACTATED RINGERS IV SOLN: 500.0000 mL | INTRAVENOUS | Status: DC | PRN

## 2022-06-14 MED ORDER — FENTANYL CITRATE (PF) 100 MCG/2ML IJ SOLN: 50.0000 ug | INTRAMUSCULAR | Status: DC | PRN

## 2022-06-14 MED ORDER — SOD CITRATE-CITRIC ACID 500-334 MG/5ML PO SOLN: 30.0000 mL | ORAL | Status: DC | PRN

## 2022-06-14 MED ORDER — OXYCODONE-ACETAMINOPHEN 5-325 MG PO TABS: 2.0000 | ORAL_TABLET | ORAL | Status: DC | PRN

## 2022-06-14 MED ORDER — LIDOCAINE HCL (PF) 1 % IJ SOLN: 30.0000 mL | INTRAMUSCULAR | Status: DC | PRN

## 2022-06-14 MED ORDER — ACETAMINOPHEN 325 MG PO TABS
650.0000 mg | ORAL_TABLET | ORAL | Status: DC | PRN
  Administered 2022-06-15: 650 mg via ORAL
  Filled 2022-06-14: qty 2

## 2022-06-14 NOTE — H&P (Addendum)
LABOR AND DELIVERY ADMISSION HISTORY AND PHYSICAL NOTE  Stephanie Hodges is a 26 y.o. female G64P0101 with IUP at 14w1dby LMP presenting for IOL due to new diagnosis of gestational hypertension.   She reports positive fetal movement. She denies leakage of fluid or vaginal bleeding.  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 - Abnormal pap (ASC-H) with colpo done 03/01/2022, needs repeat postpartum  - Anxiety/depression not on medications   Past Medical History: Past Medical History:  Diagnosis Date   Abnormal Papanicolaou smear of cervix with positive human papilloma virus (HPV) test 01/16/2019   LGSIL with +HPV, will get colpo   ASCUS with positive high risk HPV cervical 10/31/2017   Contraceptive education 10/03/2014   Headache(784.0)    migraines   History of chlamydia    Nexplanon insertion 10/16/2014   Inserted left arm 10/16/14 remove 10/16/17   No pertinent past medical history    Pregnancy induced hypertension    Pregnant    Seasonal allergies     Past Surgical History: Past Surgical History:  Procedure Laterality Date   BREAST BIOPSY Left 05/23/2019   Procedure: BREAST BIOPSY WITH NEEDLE LOCALIZATION;  Surgeon: BVirl Cagey MD;  Location: [facility];  Service: General;  Laterality: Left;    Obstetrical History: OB History     Gravida  2   Para  1   Term      Preterm  1   AB      Living  1      SAB      IAB      Ectopic      Multiple      Live Births  1           Social History: Social History   Socioeconomic History   Marital status: Single    Spouse name: Not on file   Number of children: 1   Years of education: Not on file   Highest education level: Not on file  Occupational History   Not on file  Tobacco Use   Smoking status: Former    Packs/day: 0.22    Types: Cigarettes   Smokeless tobacco: Never  Vaping Use    Vaping Use: Never used  Substance and Sexual Activity   Alcohol use: No   Drug use: No   Sexual activity: Not Currently    Birth control/protection: None  Other Topics Concern   Not on file  Social History Narrative   Not on file   Social Determinants of Health   Financial Resource Strain: Low Risk  (04/14/2022)   Overall Financial Resource Strain (CARDIA)    Difficulty of Paying Living Expenses: Not hard at all  Food Insecurity: No Food Insecurity (04/14/2022)   Hunger Vital Sign    Worried About Running Out of Food in the Last Year: Never true    Ran Out of Food in the Last Year: Never true  Transportation Needs: No Transportation Needs (04/14/2022)   PRAPARE - THydrologist(Medical): No    Lack of Transportation (Non-Medical): No  Physical Activity: Inactive (04/14/2022)   Exercise Vital Sign    Days of Exercise per Week: 0 days    Minutes of Exercise per Session: 30 min  Stress: No Stress Concern Present (04/14/2022)   FA[place]ite   Feeling of Stress :  Not at all  Social Connections: Moderately Isolated (04/14/2022)   Social Connection and Isolation Panel [NHANES]    Frequency of Communication with Friends and Family: More than three times a week    Frequency of Social Gatherings with Friends and Family: More than three times a week    Attends Religious Services: More than 4 times per year    Active Member of [employer] or Organizations: No    Attends Archivist Meetings: Never    Marital Status: Never married    Family History: Family History  Adopted: Yes  Problem Relation Age of Onset   Hypertension Mother    Stroke Mother     Allergies: Allergies  Allergen Reactions   Coconut Flavor     coconut    Medications Prior to Admission  Medication Sig Dispense Refill Last Dose   acetaminophen (TYLENOL) 325 MG tablet Take 650 mg by mouth every 6 (six) hours as needed  (pain).    06/13/2022   Ascorbic Acid (VITAMIN C PO) Take by mouth.   06/14/2022   aspirin 81 MG chewable tablet Chew 2 tablets (162 mg total) by mouth daily. 60 tablet 7 06/14/2022   famotidine (PEPCID) 20 MG tablet Take 1 tablet (20 mg total) by mouth daily. May to increase to twice daily if symptoms not improving. 60 tablet 1 Past Week   Multiple Minerals-Vitamins (CALCIUM & VIT D3 BONE HEALTH PO)    06/14/2022   Prenatal Vit-Fe Fumarate-FA (PRENATAL VITAMIN PO) Take by mouth.   06/14/2022   Blood Pressure Monitor MISC For regular home bp monitoring during pregnancy 1 each 0    Doxylamine-Pyridoxine (DICLEGIS) 10-10 MG TBEC 2 tabs q hs, if sx persist add 1 tab q am on day 3, if sx persist add 1 tab q afternoon on day 4 100 tablet 6 More than a month   metroNIDAZOLE (FLAGYL) 500 MG tablet Take 1 tablet (500 mg total) by mouth 2 (two) times daily. 14 tablet 0      Review of Systems  All systems reviewed and negative except as stated in HPI  Physical Exam Blood pressure (!) 149/109, pulse 62, temperature 98.1 F (36.7 C), temperature source Oral, resp. rate 17, height '5\' 5"'$  (1.651 m), weight 79.3 kg, last menstrual period 09/27/2021, SpO2 100 %. General appearance: alert, oriented, NAD Lungs: normal respiratory effort Heart: regular rate Abdomen: soft, non-tender; gravid, FH appropriate for GA Extremities: No calf swelling or tenderness Presentation: cephalic by BSUS  Fetal monitoring: 120/mod/15x15/none  Uterine activity: Infrequent   Dilation: Closed Effacement (%): Thick Station: -3 Exam by:: Dr Marcina Millard  Prenatal labs: ABO, Rh: --/--/A POS (07/17 2115) Antibody: NEG (07/17 2115) Rubella: 2.32 (01/25 1549) RPR: Non Reactive (05/17 0840)  HBsAg: Negative (01/25 1549)  HIV: NON REACTIVE (07/17 2117)  GC/Chlamydia: Negative  GBS: Negative/-- (07/06 1640)  2-hr GTT: passed Genetic screening: LR female, 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  Anatomy US: LVEICF by otherwise normal   Prenatal  Transfer Tool  Maternal Diabetes: No Genetic Screening: Normal Maternal Ultrasounds/Referrals: Normal Fetal Ultrasounds or other Referrals:  None Maternal Substance Abuse:  No Significant Maternal Medications:  None Significant Maternal Lab Results: Group B Strep negative  Results for orders placed or performed during the hospital encounter of 06/14/22 (from the past 24 hour(s))  Protein / creatinine ratio, urine   Collection Time: 06/14/22  6:46 PM  Result Value Ref Range   Creatinine, Urine 53 mg/dL   Total Protein, Urine <6 mg/dL   Protein  Creatinine Ratio        0.00 - 0.15 mg/mg[Cre]  Type and screen   Collection Time: 06/14/22  9:15 PM  Result Value Ref Range   ABO/RH(D) A POS    Antibody Screen NEG    Sample Expiration      06/17/2022,2359 Performed at [facility], [place] [address], [place], [place] [address]   CBC   Collection Time: 06/14/22  9:17 PM  Result Value Ref Range   WBC 6.6 4.0 - 10.5 K/uL   RBC 4.04 3.87 - 5.11 MIL/uL   Hemoglobin 11.8 (L) 12.0 - 15.0 g/dL   HCT 34.1 (L) 36.0 - 46.0 %   MCV 84.4 80.0 - 100.0 fL   MCH 29.2 26.0 - 34.0 pg   MCHC 34.6 30.0 - 36.0 g/dL   RDW 14.7 11.5 - 15.5 %   Platelets 137 (L) 150 - 400 K/uL   nRBC 0.0 0.0 - 0.2 %  Comprehensive metabolic panel   Collection Time: 06/14/22  9:17 PM  Result Value Ref Range   Sodium 137 135 - 145 mmol/L   Potassium 3.6 3.5 - 5.1 mmol/L   Chloride 108 98 - 111 mmol/L   CO2 20 (L) 22 - 32 mmol/L   Glucose, Bld 79 70 - 99 mg/dL   BUN 6 6 - 20 mg/dL   Creatinine, Ser 0.93 0.44 - 1.00 mg/dL   Calcium 8.6 (L) 8.9 - 10.3 mg/dL   Total Protein 6.1 (L) 6.5 - 8.1 g/dL   Albumin 2.6 (L) 3.5 - 5.0 g/dL   AST 25 15 - 41 U/L   ALT 18 0 - 44 U/L   Alkaline Phosphatase 201 (H) 38 - 126 U/L   Total Bilirubin 1.1 0.3 - 1.2 mg/dL   GFR, Estimated >60 >60 mL/min   Anion gap 9 5 - 15  Rapid HIV screen (HIV 1/2 Ab+Ag)   Collection Time: 06/14/22  9:17 PM  Result Value Ref Range   HIV-1 P24  Antigen - HIV24 NON REACTIVE NON REACTIVE   HIV 1/2 Antibodies NON REACTIVE NON REACTIVE   Interpretation (HIV Ag Ab)      A non reactive test result means that HIV 1 or HIV 2 antibodies and HIV 1 p24 antigen were not detected in the specimen.    Patient Active Problem List   Diagnosis Date Noted   Gestational hypertension, third trimester 06/14/2022   Hx of preterm delivery, currently pregnant 04/28/2022   Echogenic intracardiac focus of fetus on prenatal ultrasound 03/17/2022   Abnormal chromosomal and genetic finding on antenatal screening mother 01/05/2022   Encounter for supervision of normal pregnancy, antepartum 12/22/2021   Tubular adenoma of left female breast 02/06/2019   Abnormal Papanicolaou smear of cervix with positive human papilloma virus (HPV) test 01/16/2019   Mass of upper outer quadrant of left breast 01/11/2019   Anxiety and depression 01/11/2019    Assessment: Stephanie Hodges is a 77 y.o. G2P0101 at 57w1dhere for IOL due to gestational hypertension.   #Labor: Cervix closed, vertex by BSUS. Start with buccal cytotec.  #Pain: PRN  #FWB: Cat I  #ID: GBS Negative  #MOF: Breastfeeding  #MOC: Undecided  #Circ: Yes   #Gestational hypertension: BP mild range. No symptoms (denies RUQ abdominal pain, blurred/spots in vision, HA). Labs WNL with exception of platelets of 137. Cont to monitor.   #ASC-H: Repeat Colpo postpartum.   SPatriciaann Clan7/17/2023, 11:26 PM

## 2022-06-14 NOTE — MAU Note (Signed)
Stephanie Hodges is a 26 y.o. at 3w1dhere in MAU reporting: pt was for induction today.  Never received phone call this morning. Feeling some cramping, not as often as last night, like every 15 min. No bleeding, no water leaking.  Reports +FM.  Denies HA, visual changes, or epigastric pain.  Some increased swelling in feet. Onset of complaint: last night Pain score: mild Vitals:   06/14/22 1824  BP: (!) 143/90  Pulse: 86  Resp: 17  Temp: 97.8 F (36.6 C)  SpO2: 99%     FHT:144 Lab orders placed from triage:  pcr

## 2022-06-14 NOTE — Progress Notes (Signed)
Pt informed that the ultrasound is considered a limited OB ultrasound and is not intended to be a complete ultrasound exam.  Patient also informed that the ultrasound is not being completed with the intent of assessing for fetal or placental anomalies or any pelvic abnormalities.  Explained that the purpose of today's ultrasound is to assess for  presentation.  Patient acknowledges the purpose of the exam and the limitations of the study.    Cephalic  Stephanie Bisaillon, NP   

## 2022-06-15 ENCOUNTER — Inpatient Hospital Stay (HOSPITAL_COMMUNITY): Payer: Medicaid Other | Admitting: Anesthesiology

## 2022-06-15 ENCOUNTER — Encounter (HOSPITAL_COMMUNITY): Payer: Self-pay | Admitting: Obstetrics & Gynecology

## 2022-06-15 DIAGNOSIS — Z30014 Encounter for initial prescription of intrauterine contraceptive device: Secondary | ICD-10-CM

## 2022-06-15 DIAGNOSIS — O1414 Severe pre-eclampsia complicating childbirth: Secondary | ICD-10-CM

## 2022-06-15 DIAGNOSIS — Z3A37 37 weeks gestation of pregnancy: Secondary | ICD-10-CM

## 2022-06-15 LAB — MAGNESIUM: Magnesium: 6 mg/dL — ABNORMAL HIGH (ref 1.7–2.4)

## 2022-06-15 LAB — CBC
HCT: 34 % — ABNORMAL LOW (ref 36.0–46.0)
HCT: 35.2 % — ABNORMAL LOW (ref 36.0–46.0)
HCT: 35.7 % — ABNORMAL LOW (ref 36.0–46.0)
Hemoglobin: 11.6 g/dL — ABNORMAL LOW (ref 12.0–15.0)
Hemoglobin: 12 g/dL (ref 12.0–15.0)
Hemoglobin: 12.4 g/dL (ref 12.0–15.0)
MCH: 29.2 pg (ref 26.0–34.0)
MCH: 29.1 pg (ref 26.0–34.0)
MCH: 29.5 pg (ref 26.0–34.0)
MCHC: 34.1 g/dL (ref 30.0–36.0)
MCHC: 34.1 g/dL (ref 30.0–36.0)
MCHC: 34.7 g/dL (ref 30.0–36.0)
MCV: 85.6 fL (ref 80.0–100.0)
MCV: 85.2 fL (ref 80.0–100.0)
MCV: 85 fL (ref 80.0–100.0)
Platelets: 141 10*3/uL — ABNORMAL LOW (ref 150–400)
Platelets: 140 10*3/uL — ABNORMAL LOW (ref 150–400)
Platelets: 135 10*3/uL — ABNORMAL LOW (ref 150–400)
RBC: 3.97 MIL/uL (ref 3.87–5.11)
RBC: 4.13 MIL/uL (ref 3.87–5.11)
RBC: 4.2 MIL/uL (ref 3.87–5.11)
RDW: 14.7 % (ref 11.5–15.5)
RDW: 14.7 % (ref 11.5–15.5)
RDW: 14.8 % (ref 11.5–15.5)
WBC: 6.4 10*3/uL (ref 4.0–10.5)
WBC: 5.4 10*3/uL (ref 4.0–10.5)
WBC: 7.1 10*3/uL (ref 4.0–10.5)
nRBC: 0 % (ref 0.0–0.2)
nRBC: 0 % (ref 0.0–0.2)
nRBC: 0 % (ref 0.0–0.2)

## 2022-06-15 LAB — COMPREHENSIVE METABOLIC PANEL
ALT: 19 U/L (ref 0–44)
AST: 22 U/L (ref 15–41)
Albumin: 2.4 g/dL — ABNORMAL LOW (ref 3.5–5.0)
Alkaline Phosphatase: 200 U/L — ABNORMAL HIGH (ref 38–126)
Anion gap: 12 (ref 5–15)
BUN: 5 mg/dL — ABNORMAL LOW (ref 6–20)
CO2: 20 mmol/L — ABNORMAL LOW (ref 22–32)
Calcium: 8.1 mg/dL — ABNORMAL LOW (ref 8.9–10.3)
Chloride: 105 mmol/L (ref 98–111)
Creatinine, Ser: 0.95 mg/dL (ref 0.44–1.00)
GFR, Estimated: >60 mL/min (ref >60)
Glucose, Bld: 93 mg/dL (ref 70–99)
Potassium: 4 mmol/L (ref 3.5–5.1)
Sodium: 137 mmol/L (ref 135–145)
Total Bilirubin: 0.8 mg/dL (ref 0.3–1.2)
Total Protein: 5.8 g/dL — ABNORMAL LOW (ref 6.5–8.1)

## 2022-06-15 LAB — RPR: RPR Ser Ql: NONREACTIVE

## 2022-06-15 MED ORDER — ZOLPIDEM TARTRATE 5 MG PO TABS: 5.0000 mg | ORAL_TABLET | Freq: Every evening | ORAL | Status: DC | PRN

## 2022-06-15 MED ORDER — IBUPROFEN 600 MG PO TABS
600.0000 mg | ORAL_TABLET | Freq: Four times a day (QID) | ORAL | Status: DC
  Administered 2022-06-15 – 2022-06-17 (×8): 600 mg via ORAL
  Filled 2022-06-15 (×8): qty 1

## 2022-06-15 MED ORDER — LIDOCAINE HCL (PF) 1 % IJ SOLN
INTRAMUSCULAR | Status: DC | PRN
  Administered 2022-06-15 (×2): 4 mL via EPIDURAL

## 2022-06-15 MED ORDER — LABETALOL HCL 5 MG/ML IV SOLN
20.0000 mg | INTRAVENOUS | Status: DC | PRN
  Administered 2022-06-15: 20 mg via INTRAVENOUS
  Filled 2022-06-15: qty 4

## 2022-06-15 MED ORDER — ONDANSETRON HCL 4 MG/2ML IJ SOLN: 4.0000 mg | INTRAMUSCULAR | Status: DC | PRN

## 2022-06-15 MED ORDER — DIBUCAINE (PERIANAL) 1 % EX OINT: 1.0000 | TOPICAL_OINTMENT | CUTANEOUS | Status: DC | PRN

## 2022-06-15 MED ORDER — EPHEDRINE 5 MG/ML INJ: 10.0000 mg | INTRAVENOUS | Status: DC | PRN

## 2022-06-15 MED ORDER — PROMETHAZINE HCL 25 MG PO TABS
12.5000 mg | ORAL_TABLET | Freq: Four times a day (QID) | ORAL | Status: DC | PRN
  Administered 2022-06-15: 12.5 mg via ORAL
  Filled 2022-06-15 (×2): qty 1

## 2022-06-15 MED ORDER — TRANEXAMIC ACID-NACL 1000-0.7 MG/100ML-% IV SOLN
1000.0000 mg | INTRAVENOUS | Status: AC
Start: 2022-06-15 — End: 2022-06-15

## 2022-06-15 MED ORDER — TRANEXAMIC ACID-NACL 1000-0.7 MG/100ML-% IV SOLN
INTRAVENOUS | Status: AC
  Administered 2022-06-15: 1000 mg via INTRAVENOUS
  Filled 2022-06-15: qty 100

## 2022-06-15 MED ORDER — TETANUS-DIPHTH-ACELL PERTUSSIS 5-2.5-18.5 LF-MCG/0.5 IM SUSY: 0.5000 mL | PREFILLED_SYRINGE | Freq: Once | INTRAMUSCULAR | Status: DC

## 2022-06-15 MED ORDER — ONDANSETRON HCL 4 MG PO TABS: 4.0000 mg | ORAL_TABLET | ORAL | Status: DC | PRN

## 2022-06-15 MED ORDER — FENTANYL-BUPIVACAINE-NACL 0.5-0.125-0.9 MG/250ML-% EP SOLN
12.0000 mL/h | EPIDURAL | Status: DC | PRN
  Administered 2022-06-15: 12 mL/h via EPIDURAL
  Filled 2022-06-15: qty 250

## 2022-06-15 MED ORDER — WITCH HAZEL-GLYCERIN EX PADS: 1.0000 | MEDICATED_PAD | CUTANEOUS | Status: DC | PRN

## 2022-06-15 MED ORDER — ACETAMINOPHEN 325 MG PO TABS: 650.0000 mg | ORAL_TABLET | ORAL | Status: DC | PRN

## 2022-06-15 MED ORDER — DIPHENHYDRAMINE HCL 50 MG/ML IJ SOLN: 12.5000 mg | INTRAMUSCULAR | Status: DC | PRN

## 2022-06-15 MED ORDER — PRENATAL MULTIVITAMIN CH
1.0000 | ORAL_TABLET | Freq: Every day | ORAL | Status: DC
Start: 2022-06-16 — End: 2022-06-17
  Administered 2022-06-16 – 2022-06-17 (×2): 1 via ORAL
  Filled 2022-06-15 (×2): qty 1

## 2022-06-15 MED ORDER — LABETALOL HCL 5 MG/ML IV SOLN
80.0000 mg | INTRAVENOUS | Status: DC | PRN
Start: 2022-06-15 — End: 2022-06-15

## 2022-06-15 MED ORDER — ORAL CARE MOUTH RINSE: 15.0000 mL | OROMUCOSAL | Status: DC | PRN

## 2022-06-15 MED ORDER — SIMETHICONE 80 MG PO CHEW: 80.0000 mg | CHEWABLE_TABLET | ORAL | Status: DC | PRN

## 2022-06-15 MED ORDER — PHENYLEPHRINE 80 MCG/ML (10ML) SYRINGE FOR IV PUSH (FOR BLOOD PRESSURE SUPPORT): 80.0000 ug | PREFILLED_SYRINGE | INTRAVENOUS | Status: DC | PRN

## 2022-06-15 MED ORDER — COCONUT OIL OIL: 1.0000 | TOPICAL_OIL | Status: DC | PRN

## 2022-06-15 MED ORDER — LABETALOL HCL 5 MG/ML IV SOLN: 40.0000 mg | INTRAVENOUS | Status: DC | PRN

## 2022-06-15 MED ORDER — MAGNESIUM SULFATE 40 GM/1000ML IV SOLN
2.0000 g/h | INTRAVENOUS | Status: DC
  Filled 2022-06-15: qty 1000

## 2022-06-15 MED ORDER — LACTATED RINGERS IV SOLN: INTRAVENOUS | Status: DC

## 2022-06-15 MED ORDER — LACTATED RINGERS IV SOLN: 500.0000 mL | Freq: Once | INTRAVENOUS | Status: DC

## 2022-06-15 MED ORDER — SENNOSIDES-DOCUSATE SODIUM 8.6-50 MG PO TABS
2.0000 | ORAL_TABLET | Freq: Every day | ORAL | Status: DC
  Administered 2022-06-16: 2 via ORAL
  Filled 2022-06-15: qty 2

## 2022-06-15 MED ORDER — LEVONORGESTREL 20 MCG/DAY IU IUD
1.0000 | INTRAUTERINE_SYSTEM | Freq: Once | INTRAUTERINE | Status: AC
  Administered 2022-06-15: 1 via INTRAUTERINE
  Filled 2022-06-15: qty 1

## 2022-06-15 MED ORDER — HYDRALAZINE HCL 20 MG/ML IJ SOLN
10.0000 mg | INTRAMUSCULAR | Status: DC | PRN
Start: 2022-06-15 — End: 2022-06-15

## 2022-06-15 MED ORDER — BENZOCAINE-MENTHOL 20-0.5 % EX AERO: 1.0000 | INHALATION_SPRAY | CUTANEOUS | Status: DC | PRN

## 2022-06-15 MED ORDER — MAGNESIUM SULFATE BOLUS VIA INFUSION
4.0000 g | Freq: Once | INTRAVENOUS | Status: AC
  Administered 2022-06-15: 4 g via INTRAVENOUS
  Filled 2022-06-15: qty 1000

## 2022-06-15 MED ORDER — PROCHLORPERAZINE EDISYLATE 10 MG/2ML IJ SOLN
5.0000 mg | Freq: Four times a day (QID) | INTRAMUSCULAR | Status: DC | PRN
  Administered 2022-06-15: 5 mg via INTRAVENOUS
  Filled 2022-06-15 (×2): qty 1

## 2022-06-15 MED ORDER — DIPHENHYDRAMINE HCL 25 MG PO CAPS: 25.0000 mg | ORAL_CAPSULE | Freq: Four times a day (QID) | ORAL | Status: DC | PRN

## 2022-06-15 MED ORDER — MAGNESIUM SULFATE 40 GM/1000ML IV SOLN
2.0000 g/h | INTRAVENOUS | Status: AC
  Administered 2022-06-15: 2 g/h via INTRAVENOUS
  Filled 2022-06-15: qty 1000

## 2022-06-15 NOTE — Discharge Summary (Signed)
   Postpartum Discharge Summary  Date of Service updated 06/17/22     Patient Name: Stephanie Hodges DOB: 10/30/1996 MRN: [id number]  Date of admission: 06/14/2022 Delivery date:06/15/2022  Delivering provider: WILLIAMS, ASHLEY S  Date of discharge: 06/17/22  Admitting diagnosis: Gestational hypertension, third trimester [O13.3] Intrauterine pregnancy: [redacted]w[redacted]d     Secondary diagnosis:  Principal Problem:   Gestational hypertension, third trimester Active Problems:   Abnormal Papanicolaou smear of cervix with positive human papilloma virus (HPV) test   Encounter for supervision of normal pregnancy, antepartum   Echogenic intracardiac focus of fetus on prenatal ultrasound   Vaginal delivery  Additional problems: none    Discharge diagnosis: Term Pregnancy Delivered and Preeclampsia (severe)                                              Post partum procedures: NA Augmentation: AROM, Pitocin, and Cytotec Complications: None  Hospital course: Induction of Labor With Vaginal Delivery   26 y.o. yo G2P0101 at [redacted]w[redacted]d was admitted to the hospital 06/14/2022 for induction of labor.  Indication for induction:  severe pre-eclampsia .  Patient had an uncomplicated labor course as follows: Membrane Rupture Time/Date: 9:09 AM ,06/15/2022   Delivery Method:Vaginal, Spontaneous  Episiotomy: None  Lacerations:  Periurethral  Details of delivery can be found in separate delivery note.  Patient had a routine postpartum course. She received magnesium x 24 hours. BP was stable without medications after completion of the magnesium. She progressed to ambulating, voiding, passing flatus tolerating diet and good oral pain control. Felt amendable for discharge home. Patient is discharged home 06/17/22.  Newborn Data: Birth date:06/15/2022  Birth time:12:53 PM  Gender:Female  Living status:Living  Apgars:8 ,9  Weight:2948 g   Magnesium Sulfate received: Yes: Seizure prophylaxis BMZ received:  Yes Rhophylac:N/A MMR:N/A T-DaP:Given prenatally Flu: No Transfusion:No  Physical exam  Vitals:   06/16/22 1950 06/16/22 2331 06/17/22 0322 06/17/22 0827  BP: 119/89 131/77 138/68 133/82  Pulse: 60 (!) 51 (!) 51 (!) 48  Resp: 16 16 16 16  Temp: 98.6 F (37 C) 97.9 F (36.6 C) 98.2 F (36.8 C) 97.9 F (36.6 C)  TempSrc: Oral Oral Oral Oral  SpO2: 98% 99% 100% 99%  Weight:      Height:       General: alert Lochia: appropriate Uterine Fundus: firm Incision: N/A DVT Evaluation: No evidence of DVT seen on physical exam. Labs: Lab Results  Component Value Date   WBC 9.4 06/16/2022   HGB 11.4 (L) 06/16/2022   HCT 32.0 (L) 06/16/2022   MCV 83.1 06/16/2022   PLT 139 (L) 06/16/2022      Latest Ref Rng & Units 06/15/2022    8:52 AM  CMP  Glucose 70 - 99 mg/dL 93   BUN 6 - 20 mg/dL 5   Creatinine 0.44 - 1.00 mg/dL 0.95   Sodium 135 - 145 mmol/L 137   Potassium 3.5 - 5.1 mmol/L 4.0   Chloride 98 - 111 mmol/L 105   CO2 22 - 32 mmol/L 20   Calcium 8.9 - 10.3 mg/dL 8.1   Total Protein 6.5 - 8.1 g/dL 5.8   Total Bilirubin 0.3 - 1.2 mg/dL 0.8   Alkaline Phos 38 - 126 U/L 200   AST 15 - 41 U/L 22   ALT 0 - 44 U/L 19    Edinburgh Score:       No data to display           After visit meds:  Allergies as of 06/17/2022       Reactions   Coconut Flavor    coconut        Medication List     STOP taking these medications    acetaminophen 325 MG tablet Commonly known as: TYLENOL   aspirin 81 MG chewable tablet   CALCIUM & VIT D3 BONE HEALTH PO   Doxylamine-Pyridoxine 10-10 MG Tbec Commonly known as: Diclegis   metroNIDAZOLE 500 MG tablet Commonly known as: FLAGYL   VITAMIN C PO       TAKE these medications    Blood Pressure Monitor Misc For regular home bp monitoring during pregnancy   famotidine 20 MG tablet Commonly known as: PEPCID Take 1 tablet (20 mg total) by mouth daily. May to increase to twice daily if symptoms not improving.    ibuprofen 600 MG tablet Commonly known as: ADVIL Take 1 tablet (600 mg total) by mouth every 6 (six) hours.   PRENATAL VITAMIN PO Take by mouth.         Discharge home in stable condition Infant Feeding: Bottle Infant Disposition:home with mother Discharge instruction: per After Visit Summary and Postpartum booklet. Activity: Advance as tolerated. Pelvic rest for 6 weeks.  Diet: routine diet Future Appointments: Future Appointments  Date Time Provider Department Center  06/22/2022 10:10 AM [facility] NURSE [facility]  07/27/2022 11:30 AM Booker, Kimberly R, CNM [facility]   Follow up Visit:  Follow-up Information     [facility]. Schedule an appointment as soon as possible for a visit in 1 week(s).   Specialty: Obstetrics and Gynecology Why: 1 week for BP check 4 weeks for PP visit Contact information: [address] [place] [address] [phone]               Message sent to [facility] by Dr. Das on 7/18  Please schedule this patient for a In person postpartum visit in 6 weeks with the following provider: Any provider. Additional Postpartum F/U:BP check 1 week  High risk pregnancy complicated by:  severe preclampsia Delivery mode:  Vaginal, Spontaneous  Anticipated Birth Control:  PP IUD placed   06/17/2022 Michael L Ervin, MD    

## 2022-06-15 NOTE — Progress Notes (Signed)
Stephanie Hodges is a 26 y.o. G2P0101 at 9w2dby LMP admitted for induction of labor due to Hypertension. Transitioned into Pre Eclampsia with SF.   Subjective: Patient doing well. She "does not feel" because of the magnesium but is otherwise in good spirits. FOB at bedside and is supportive.   Objective: BP 127/79   Pulse 74   Temp 98 F (36.7 C) (Oral)   Resp 16   Ht '5\' 5"'$  (1.651 m)   Wt 79.3 kg   LMP 09/27/2021 (Exact Date)   SpO2 97%   BMI 29.09 kg/m  No intake/output data recorded. No intake/output data recorded.  FHT:  FHR: 130 bpm, variability: moderate,  accelerations:  Present,  decelerations:  Absent UC:   regular, every  3-4 minutes SVE:   Dilation: Closed Effacement (%): Thick Station: -3 Exam by:: Dr SMarcina Millard Labs: Lab Results  Component Value Date   WBC 6.6 06/14/2022   HGB 11.8 (L) 06/14/2022   HCT 34.1 (L) 06/14/2022   MCV 84.4 06/14/2022   PLT 137 (L) 06/14/2022    Assessment / Plan: Induction of labor due to preeclampsia,  on cytotec.   Labor: Cytotec x1. Possibly a FB at next exam.  Preeclampsia: on magnesium sulfate, BPs out of severe range. Continue to monitor.  Fetal Wellbeing:  Category I continue to monitor for signs of distress.  Pain Control:  Epidural, Patient may have epidural upon request.  I/D:  GBS Negative  Anticipated MOD:  NSVD  SJacquiline Doe CNM 06/15/2022, 2:36 AM

## 2022-06-15 NOTE — Progress Notes (Signed)
Labor Progress Note Stephanie Hodges is a 26 y.o. G2P0101 at 63w2dpresented for IOL for gHTN w/ SIPE w/ SF S: Comfortable with epidural, reports having gotten some rest. Reports sensation of fluid leakage. Endorses a mild headache at this time, no reported vision changes. No other acute concerns.  O:  BP 121/72   Pulse 63   Temp 97.8 F (36.6 C) (Oral)   Resp 16   Ht '5\' 5"'$  (1.651 m)   Wt 79.3 kg   LMP 09/27/2021 (Exact Date)   SpO2 98%   BMI 29.09 kg/m  EFM: 120 bpm/moderate variability/+accels, no decels  CVE: Dilation: 3.5 Effacement (%): 70 Cervical Position: Middle Station: -3 Presentation: Vertex Exam by:: Dr. DCy Blamer  A&P: 26y.o. G2P0101 362w2dOL for gHTN w/ SIPE w/ SF #Labor: Progressing well. S/p AROM with clear fluid. Continue to titrate pitocin. Anticipate vaginal delivery. #Pain: Epidural #FWB: Cat 1 #GBS negative #Pre-eclampsia w/ SF: treating HA w/ tylenol PRN. Repeat labs demonstrate normal LFTs, mag level 6.0. Continue to monitor for s/s eclampsia.  AsFabiola BackerMD 9:48 AM

## 2022-06-15 NOTE — Progress Notes (Signed)
Labor Progress Note   Called by RN that patient has had several severe range BP's, >15 minutes apart. Mets criteria for pre-eclampsia with severe features. Evaluated patient at bedside. Discussed new diagnosis/progression from gestational hypertension. She still reports feeling well without any additional s/sx.   Recommended starting IV Mag for seizure prophylaxis and IV labetalol for improved BP control. She is in agreement. Can add oral anti-hypertensive if persistently elevated after above regimen. Monitor labs Q12 if remain WNL.   Patriciaann Clan, DO

## 2022-06-15 NOTE — Lactation Note (Signed)
This note was copied from a baby's chart. Lactation Consultation Note  Patient Name: Stephanie Hodges [id number] Date: 06/15/2022 Reason for consult: Initial assessment;Mother's request;1st time breastfeeding;Early term 37-38.6wks;Breastfeeding assistance;Other (Comment) (PIH ( MgSulfate)) Age:26 hours  Infant crying, not consolable. Attempted latch, popping on and off. Provider arrived to complete their assessment.  [place] set up DEBP.   Plan 1. To feed based on cues 8-12x 24hr period. Mom to offer breasts and look for signs of milk transfer.  2. Mom to supplement with EBM with pace bottle feeding and slow flow nipple 5-7 ml per feeding.  3. Mom to post pump after each feeding for 15 mins.   All questions answered at the end of the visit.   Maternal Data Has patient been taught Hand Expression?: Yes Does the patient have breastfeeding experience prior to this delivery?: No  Feeding Mother's Current Feeding Choice: Breast Milk  LATCH Score Latch: Repeated attempts needed to sustain latch, nipple held in mouth throughout feeding, stimulation needed to elicit sucking reflex.  Audible Swallowing: None  Type of Nipple: Everted at rest and after stimulation  Comfort (Breast/Nipple): Soft / non-tender  Hold (Positioning): Assistance needed to correctly position infant at breast and maintain latch.  LATCH Score: 6   Lactation Tools Discussed/Used Tools: Pump;Flanges Flange Size: 24 Breast pump type: Double-Electric Breast Pump Pump Education: Setup, frequency, and cleaning;Milk Storage Reason for Pumping: increase stimulation Pumping frequency: post pump after feeding for 15 mins  Interventions Interventions: Breast feeding basics reviewed;Assisted with latch;Skin to skin;Breast massage;Hand express;Breast compression;Adjust position;Support pillows;Position options;Expressed milk;DEBP;Education;Pace feeding;LC Magazine features editor;Infant Driven Feeding Algorithm  education  Discharge Pump: DEBP;Personal WIC Program: Yes  Consult Status Consult Status: Follow-up Date: 06/16/22 Follow-up type: In-patient    Stephanie Sambrano  Hodges 06/15/2022, 5:00 PM

## 2022-06-15 NOTE — Anesthesia Procedure Notes (Signed)
Epidural Patient location during procedure: OB Start time: 06/15/2022 5:52 AM End time: 06/15/2022 5:55 AM  Staffing Anesthesiologist: Brennan Bailey, MD Performed: anesthesiologist   Preanesthetic Checklist Completed: patient identified, IV checked, risks and benefits discussed, monitors and equipment checked, pre-op evaluation and timeout performed  Epidural Patient position: sitting Prep: DuraPrep and site prepped and draped Patient monitoring: continuous pulse ox, blood pressure and heart rate Approach: midline Location: L3-L4 Injection technique: LOR air  Needle:  Needle type: Tuohy  Needle gauge: 17 G Needle length: 9 cm Needle insertion depth: 6 cm Catheter type: closed end flexible Catheter size: 19 Gauge Catheter at skin depth: 11 cm Test dose: negative and Other (1% lidocaine)  Assessment Events: blood not aspirated, injection not painful, no injection resistance, no paresthesia and negative IV test  Additional Notes Patient identified. Risks, benefits, and alternatives discussed with patient including but not limited to bleeding, infection, nerve damage, paralysis, failed block, incomplete pain control, headache, blood pressure changes, nausea, vomiting, reactions to medication, itching, and postpartum back pain. Confirmed with bedside nurse the patient's most recent platelet count. Confirmed with patient that they are not currently taking any anticoagulation, have any bleeding history, or any family history of bleeding disorders. Patient expressed understanding and wished to proceed. All questions were answered. Sterile technique was used throughout the entire procedure. Please see nursing notes for vital signs.   Crisp LOR on first pass. Test dose was given through epidural catheter and negative prior to continuing to dose epidural or start infusion. Warning signs of high block given to the patient including shortness of breath, tingling/numbness in hands, complete  motor block, or any concerning symptoms with instructions to call for help. Patient was given instructions on fall risk and not to get out of bed. All questions and concerns addressed with instructions to call with any issues or inadequate analgesia.  Reason for block:procedure for pain

## 2022-06-15 NOTE — Anesthesia Preprocedure Evaluation (Signed)
Anesthesia Evaluation  Patient identified by MRN, date of birth, ID band Patient awake    Reviewed: Allergy & Precautions, Patient's Chart, lab work & pertinent test results  History of Anesthesia Complications Negative for: history of anesthetic complications  Airway Mallampati: II  TM Distance: >3 FB Neck ROM: Full    Dental no notable dental hx.    Pulmonary former smoker,    Pulmonary exam normal        Cardiovascular hypertension, Normal cardiovascular exam     Neuro/Psych  Headaches, Anxiety Depression    GI/Hepatic negative GI ROS, Neg liver ROS,   Endo/Other  negative endocrine ROS  Renal/GU negative Renal ROS  negative genitourinary   Musculoskeletal negative musculoskeletal ROS (+)   Abdominal   Peds  Hematology negative hematology ROS (+)   Anesthesia Other Findings Day of surgery medications reviewed with patient.  Reproductive/Obstetrics (+) Pregnancy (preE on Mg)                             Anesthesia Physical Anesthesia Plan  ASA: 3  Anesthesia Plan: Epidural   Post-op Pain Management:    Induction:   PONV Risk Score and Plan: Treatment may vary due to age or medical condition  Airway Management Planned: Natural Airway  Additional Equipment: Fetal Monitoring  Intra-op Plan:   Post-operative Plan:   Informed Consent: I have reviewed the patients History and Physical, chart, labs and discussed the procedure including the risks, benefits and alternatives for the proposed anesthesia with the patient or authorized representative who has indicated his/her understanding and acceptance.       Plan Discussed with:   Anesthesia Plan Comments:         Anesthesia Quick Evaluation

## 2022-06-15 NOTE — Lactation Note (Signed)
This note was copied from a baby's chart. Lactation Consultation Note  Patient Name: Boy Peachie Barkalow [id number] Date: 06/15/2022 Reason for consult: L&D Initial assessment;Early term 37-38.6wks Age:26 hours  Infant latched with relative ease. Mom was needing help with hand placement. I latched infant and showed grandmother and father how they could help hold infant in position and provide breathing space for infant.   Mom stated that she needed a pump b/c she was not planning to just feed infant at the breast. I told her we could discuss her pump situation once she was in her postpartum room.   Maternal Data Has patient been taught Hand Expression?: No  Feeding Mother's Current Feeding Choice: Breast Milk  LATCH Score Latch: Grasps breast easily, tongue down, lips flanged, rhythmical sucking.  Audible Swallowing: None  Type of Nipple: Everted at rest and after stimulation  Comfort (Breast/Nipple): Soft / non-tender  Hold (Positioning): Full assist, staff holds infant at breast  LATCH Score: 6  Interventions Interventions: Education;Adjust position;Assisted with latch  Discharge WIC Program: Yes  Consult Status Consult Status: Follow-up from L&D    Matthias Hughs [facility] 06/15/2022, 1:40 PM

## 2022-06-15 NOTE — Progress Notes (Signed)
Stephanie Hodges is a 26 y.o. G2P0101 at 47w2dby LMP admitted for induction of labor due to Pre-eclamptic toxemia of pregnancy.  Subjective: Patient resting well throughout the night.  Denies headache, blurred vision and epigastric pain.   Objective: BP 127/78   Pulse (!) 59   Temp 97.6 F (36.4 C) (Oral)   Resp 16   Ht '5\' 5"'$  (1.651 m)   Wt 79.3 kg   LMP 09/27/2021 (Exact Date)   SpO2 99%   BMI 29.09 kg/m  No intake/output data recorded. Total I/O In: 1207.3 [P.O.:357; I.V.:850.3] Out: 200 [Urine:200]  FHT:  FHR: 125 bpm, variability: moderate,  accelerations:  Present,  decelerations:  Absent UC:   regular, every 2-3 minutes SVE:   Dilation: 2 Effacement (%): 50 Station: -3 Exam by:: Stephanie Hodges Labs: Lab Results  Component Value Date   WBC 6.4 06/15/2022   HGB 11.6 (L) 06/15/2022   HCT 34.0 (L) 06/15/2022   MCV 85.6 06/15/2022   PLT 141 (L) 06/15/2022    Assessment / Plan: Induction of labor due to preeclampsia,  Labor: Progressing normally. Pitocin initiated @ 0425 Preeclampsia:  labs stable, repeat MAg for 9AM. Continue to monitor for worsening signs of PreE.  Fetal Wellbeing:  Category I continue to watch for signs of distress.  Pain Control:   Currently Labor support without medications. Patient may have an epidural upon request  I/D:   GBS Negative  Anticipated MOD:  NSVD  Stephanie Hodges CNM 06/15/2022, 6:29 AM

## 2022-06-16 LAB — CBC
HCT: 32 % — ABNORMAL LOW (ref 36.0–46.0)
Hemoglobin: 11.4 g/dL — ABNORMAL LOW (ref 12.0–15.0)
MCH: 29.6 pg (ref 26.0–34.0)
MCHC: 35.6 g/dL (ref 30.0–36.0)
MCV: 83.1 fL (ref 80.0–100.0)
Platelets: 139 10*3/uL — ABNORMAL LOW (ref 150–400)
RBC: 3.85 MIL/uL — ABNORMAL LOW (ref 3.87–5.11)
RDW: 14.6 % (ref 11.5–15.5)
WBC: 9.4 10*3/uL (ref 4.0–10.5)
nRBC: 0 % (ref 0.0–0.2)

## 2022-06-16 MED ORDER — POLYETHYLENE GLYCOL 3350 17 G PO PACK
17.0000 g | PACK | Freq: Every day | ORAL | Status: DC
  Administered 2022-06-16: 17 g via ORAL
  Filled 2022-06-16: qty 1

## 2022-06-16 NOTE — Progress Notes (Signed)
CSW consulted for "Abnormal finding on genetic screening". CSW followed up with pediatrician who reported that this does not warrant a social work consult.  MOB has a history of depression/anxiety. * Referral screened out by Clinical Social Worker because none of the following criteria appear to apply: ~ History of anxiety/depression during this pregnancy, or of post-partum depression following prior delivery. No concerns noted in prenatal records.  ~ Diagnosis of anxiety and/or depression within last 3 years. Per chart review, MOB's depression/anxiety dates back to February 2020.  OR * MOB's symptoms currently being treated with medication and/or therapy. Please contact the Clinical Social Worker if needs arise, by [facility] request, or if MOB scores greater than 9/yes to question 10 on Edinburgh Postpartum Depression Screen.  Abundio Miu, [place] Worker [facility] Cell#: [phone]

## 2022-06-16 NOTE — Progress Notes (Signed)
Post Partum Day 1 TSVD with SIPEC and IUD placement   Subjective: Pt without complaints this morning. Denies HA or blurry vision. Pain controlled. Ambulating and voiding without problems Tolerating diet  Objective: Blood pressure 130/79, pulse 68, temperature 98.3 F (36.8 C), temperature source Oral, resp. rate 19, height '5\' 5"'$  (1.651 m), weight 79.3 kg, last menstrual period 09/27/2021, SpO2 99 %, unknown if currently breastfeeding.  Physical Exam:  General: alert Lochia: appropriate Uterine Fundus: firm Incision: NA DVT Evaluation: No evidence of DVT seen on physical exam.  Recent Labs    06/15/22 1359 06/16/22 0535  HGB 12.4 11.4*  HCT 35.7* 32.0*    Assessment/Plan: PPD # 1 TSVD SIPE  Doing well. Magnesium to finish this afternoon. BP and labs stable. Continue with progressive care   LOS: 2 days   Chancy Milroy, MD 06/16/2022, 9:18 AM

## 2022-06-16 NOTE — Lactation Note (Signed)
This note was copied from a baby's chart. Lactation Consultation Note  Patient Name: Stephanie Hodges [id number] Date: 06/16/2022 Reason for consult: Follow-up assessment;1st time breastfeeding;Early term 37-38.6wks Age:26 hours  LC in to visit with P2 Mom of ET infant < 6lbs.  Baby is at a 2% weight loss as Mom starting supplementing baby with formula by bottle. Mom had felt so exhausted she could not attempt to latch baby to the breast, and hasn't pumped but the one time when she was shown how to pump.   Mom with a room full of visitors.   LC demonstrated how to wash the pump parts in basin and place on paper towel to dry.  Offered to assist with latching and/or pumping.  Mom states she will want help later.  Encouraged Mom to first place baby STS on her chest, watch baby for feeding cues and ask for help with latching to the breast.  Mom to supplement baby per feeding guidelines increasing as tolerated.  Mom to offer her EBM before formula.    Lactation Tools Discussed/Used Tools: Pump;Flanges;Bottle Flange Size: 24 Breast pump type: Double-Electric Breast Pump;Manual Pump Education: Setup, frequency, and cleaning;Milk Storage Reason for Pumping: Support milk supply/infant <6lbs/MgSO4 Pumping frequency: Encouraged Mom to pump if baby continues supplement Pumped volume: 0 mL  Interventions Interventions: Breast feeding basics reviewed;Skin to skin;Breast massage;Hand express;DEBP;Pace feeding  Discharge Pump: [facility] Loaner (Mom has a [facility] appt this Friday 7/21)  Consult Status Consult Status: Follow-up Date: 06/17/22 Follow-up type: In-patient    Broadus John 06/16/2022, 6:14 PM

## 2022-06-17 ENCOUNTER — Encounter: Payer: Medicaid Other | Admitting: Obstetrics & Gynecology

## 2022-06-17 MED ORDER — IBUPROFEN 600 MG PO TABS: 600.0000 mg | ORAL_TABLET | Freq: Four times a day (QID) | ORAL | 0 refills | Status: DC

## 2022-06-17 NOTE — Progress Notes (Signed)
Patient ID: Stephanie Hodges, female   DOB: 10-17-1996, 26 y.o.   MRN: [id number] Attending Circumcision Counseling Progress Note  Patient desires circumcision for her female infant.  Circumcision procedure details discussed, risks and benefits of procedure were also discussed.  These include but are not limited to: Benefits of circumcision in men include reduction in the rates of urinary tract infection (UTI), penile cancer, some sexually transmitted infections, penile inflammatory and retractile disorders, as well as easier hygiene.  Risks include bleeding , infection, injury of glans which may lead to penile deformity or urinary tract issues, unsatisfactory cosmetic appearance and other potential complications related to the procedure.  It was emphasized that this is an elective procedure.  Patient wants to proceed with circumcision; written informed consent obtained.  Will do circumcision soon, routine circumcision and post circumcision care ordered for the infant.  Aryn Safran L. Rip Harbour, M.D. 06/17/2022 9:08 AM

## 2022-06-17 NOTE — Plan of Care (Signed)
Pt to be discharged home with printed instructions. No concerns noted. Danyael Alipio L Alfonzo Arca, RN  

## 2022-06-17 NOTE — Anesthesia Postprocedure Evaluation (Signed)
Anesthesia Post Note  Patient: Stephanie Hodges  Procedure(s) Performed: AN AD [place]     Patient location during evaluation: Mother Baby Anesthesia Type: Epidural Level of consciousness: awake and alert Pain management: pain level controlled Vital Signs Assessment: post-procedure vital signs reviewed and stable Respiratory status: spontaneous breathing, nonlabored ventilation and respiratory function stable Cardiovascular status: stable Postop Assessment: no headache, no backache and epidural receding Anesthetic complications: no   No notable events documented.  Last Vitals:  Vitals:   06/17/22 0322 06/17/22 0827  BP: 138/68 133/82  Pulse: (!) 51 (!) 48  Resp: 16 16  Temp: 36.8 C 36.6 C  SpO2: 100% 99%    Last Pain:  Vitals:   06/17/22 0827  TempSrc: Oral  PainSc:                  [place] S

## 2022-06-22 ENCOUNTER — Ambulatory Visit (INDEPENDENT_AMBULATORY_CARE_PROVIDER_SITE_OTHER): Payer: Medicaid Other | Admitting: *Deleted

## 2022-06-22 VITALS — BP 124/89 | HR 74

## 2022-06-22 DIAGNOSIS — Z0130 Encounter for examination of blood pressure without abnormal findings: Secondary | ICD-10-CM

## 2022-06-22 DIAGNOSIS — Z8759 Personal history of other complications of pregnancy, childbirth and the puerperium: Secondary | ICD-10-CM

## 2022-06-22 NOTE — Progress Notes (Signed)
   NURSE VISIT- BLOOD PRESSURE CHECK  SUBJECTIVE:  Stephanie Hodges is a 26 y.o. G3P1102 female here for BP check. She is postpartum, delivery date 06/15/22     HYPERTENSION ROS:  Postpartum:  Severe headaches that don't go away with tylenol/other medicines: No  Visual changes (seeing spots/double/blurred vision) No  Severe pain under right breast breast or in center of upper chest No  Severe nausea/vomiting No  Taking medicines as instructed not applicable   OBJECTIVE:  There were no vitals taken for this visit.  Appearance alert, well appearing, and in no distress and oriented to person, place, and time.  ASSESSMENT: Postpartum  blood pressure check  PLAN: Discussed with Dr. Nelda Marseille   Recommendations: no changes needed   Follow-up: as scheduled   Alice Rieger  06/22/2022 10:38 AM

## 2022-06-24 ENCOUNTER — Encounter: Payer: Medicaid Other | Admitting: Advanced Practice Midwife

## 2022-07-02 ENCOUNTER — Encounter: Payer: Medicaid Other | Admitting: Advanced Practice Midwife

## 2022-07-04 ENCOUNTER — Inpatient Hospital Stay (HOSPITAL_COMMUNITY): Admit: 2022-07-04 | Payer: Medicaid Other | Admitting: Obstetrics & Gynecology

## 2022-07-27 ENCOUNTER — Ambulatory Visit: Payer: Medicaid Other | Admitting: Women's Health

## 2022-08-04 ENCOUNTER — Encounter: Payer: Self-pay | Admitting: Advanced Practice Midwife

## 2022-08-04 ENCOUNTER — Ambulatory Visit (INDEPENDENT_AMBULATORY_CARE_PROVIDER_SITE_OTHER): Payer: Medicaid Other | Admitting: Advanced Practice Midwife

## 2022-08-04 DIAGNOSIS — Z975 Presence of (intrauterine) contraceptive device: Secondary | ICD-10-CM | POA: Diagnosis not present

## 2022-08-04 NOTE — Progress Notes (Signed)
Post Partum Visit Note   Chief Complaint:   Postpartum Care (Has IUD)  History of Present Illness:   Stephanie Hodges is a 26 y.o. G62P1102 African American female being seen today for a postpartum visit. She is 6 weeks postpartum following a spontaneous vaginal delivery at 37.2 gestational weeks. IOL: Yes, for Preeclampsia. Anesthesia: epidural.  Laceration: periurethral.  Complications: none. Inpatient contraception: yes, Mirena IUD Pregnancy complicated by severe preeclampsia . Tobacco use: no. Substance use disorder: no. 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 Next colpo due: 8-12 weeks PP No LMP recorded.  Postpartum course has been uncomplicated. Bleeding  light bleeding . Bowel function is normal. Bladder function is normal. Urinary incontinence? No, fecal incontinence? No Patient is sexually active. Last sexual activity: last night.    Upstream - 08/04/22 1037       Pregnancy Intention Screening   Does the patient want to become pregnant in the next year? No    Does the patient's partner want to become pregnant in the next year? No    Would the patient like to discuss contraceptive options today? No      Contraception Wrap Up   Current Method IUD or IUS    End Method IUD or IUS    Contraception Counseling Provided No            The pregnancy intention screening data noted above was reviewed. Potential methods of contraception were discussed. The patient elected to proceed with IUD or IUS.  Edinburgh Postpartum Depression Screening: Negative  Edinburgh Postnatal Depression Scale - 08/04/22 1031       Edinburgh Postnatal Depression Scale:  In the Past 7 Days   I have been able to laugh and see the funny side of things. 0    I have looked forward with enjoyment to things. 0    I have blamed myself unnecessarily when things went wrong. 0    I have been anxious or worried for no good reason. 0    I have felt scared or panicky for no good reason. 0    Things have been getting on  top of me. 1    I have been so unhappy that I have had difficulty sleeping. 0    I have felt sad or miserable. 0    I have been so unhappy that I have been crying. 0    The thought of harming myself has occurred to me. 0    Edinburgh Postnatal Depression Scale Total 1            Baby's course has been uncomplicated. Baby is feeding by bottle. Infant has a pediatrician/family doctor? Yes.  Childcare strategy if returning to work/school: yes.  Pt has material needs met for her and baby: Yes.   Review of Systems:   Pertinent items are noted in HPI Denies Abnormal vaginal discharge w/ itching/odor/irritation, headaches, visual changes, shortness of breath, chest pain, abdominal pain, severe nausea/vomiting, or problems with urination or bowel movements. Pertinent History Reviewed:  Reviewed past medical,surgical, obstetrical and family history.  Reviewed problem list, medications and allergies. OB History  Gravida Para Term Preterm AB Living  '2 2 1 1   2  ' SAB IAB Ectopic Multiple Live Births        0 2    # Outcome Date GA Lbr Len/2nd Weight Sex Delivery Anes PTL Lv  2 Term 06/15/22 71w2d03:11 / 00:33 6 lb 8 oz (2.948 kg) M Vag-Spont EPI  LIV  1 Preterm  07/07/12 70w0d26:05 / 01:41 5 lb 8 oz (2.495 kg) F Vag-Spont EPI N LIV     Birth Comments: molding     Complications: Preterm premature rupture of membranes   Physical Assessment:   Vitals:   08/04/22 1025  BP: 127/86  Pulse: 72  Weight: 148 lb (67.1 kg)  Height: '5\' 5"'  (1.651 m)  Body mass index is 24.63 kg/m.  Objective:  Blood pressure 127/86, pulse 72, height '5\' 5"'  (1.651 m), weight 148 lb (67.1 kg), not currently breastfeeding.  General:  alert, cooperative, and no distress   Breasts:  negative  Lungs: Normal respiratory effort  Heart:  regular rate and rhythm  Abdomen: soft, non-tender,    Vulva:  normal  Vagina: normal vagina IUD strings not visible. Bedside [employer] proper placement in fundus  Cervix:   normal  Corpus: Well involuted  Adnexa:  not evaluated  Rectal Exam: no hemorrhoids          No results found for this or any previous visit (from the past 24 hour(s)).  Assessment & Plan:  1) Postpartum exam 2) 6 wks s/p spontaneous vaginal delivery 3) bottle feeding 4) Depression screening 5) Contraception management: IUD strings checked  Essential components of care per ACOG recommendations:  1.  Mood and well being:  If positive depression screen, discussed and plan developed.  If using tobacco we discussed reduction/cessation and risk of relapse If current substance abuse, we discussed and referral to local resources was offered.   2. Infant care and feeding:  If breastfeeding, discussed returning to work, pumping, breastfeeding-associated pain, guidance regarding return to fertility while lactating if not using another method. If needed, patient was provided with a letter to be allowed to pump q 2-3hrs to support lactation in a private location with access to a refrigerator to store breastmilk.   Recommended that all caregivers be immunized for flu, pertussis and other preventable communicable diseases If pt does not have material needs met for her/baby, referred to local resources for help obtaining these.  3. Sexuality, contraception and birth spacing Provided guidance regarding sexuality, management of dyspareunia, and resumption of intercourse Discussed avoiding interpregnancy interval <666ms and recommended birth spacing of 18 months  4. Sleep and fatigue Discussed coping options for fatigue and sleep disruption Encouraged family/partner/community support of 4 hrs of uninterrupted sleep to help with mood and fatigue  5. Physical recovery  If pt had a C/S, assessed incisional pain and providing guidance on normal vs prolonged recovery If pt had a laceration, perineal healing and pain reviewed.  If urinary or fecal incontinence, discussed management and referred to PT  or uro/gyn if indicated  Patient is safe to resume physical activity. Discussed attainment of healthy weight.  6.  Chronic disease management Discussed pregnancy complications if any, and their implications for future childbearing and long-term maternal health. Review recommendations for prevention of recurrent pregnancy complications, such as aspirin to reduce risk of preeclampsia yes. Pt had GDM: No. If yes, 2hr GTT scheduled: not applicable. Reviewed medications and non-pregnant dosing including consideration of whether pt is breastfeeding using a reliable resource such as LactMed: not applicable Referred for f/u w/ PCP or subspecialist providers as indicated: not applicable  7. Health maintenance Mammogram at 4069yor earlier if indicated Pap smears as indicated  Meds: No orders of the defined types were placed in this encounter.   Follow-up: Return for 2-4 weeks for colpo.   No orders of the defined types were placed in this  encounter.      [place] for [employer], [facility] 08/04/2022 11:01 AM

## 2022-08-30 ENCOUNTER — Encounter: Payer: Self-pay | Admitting: Women's Health

## 2022-08-30 ENCOUNTER — Encounter: Payer: Medicaid Other | Admitting: Women's Health

## 2022-09-13 ENCOUNTER — Other Ambulatory Visit (HOSPITAL_COMMUNITY)
Admission: RE | Admit: 2022-09-13 | Discharge: 2022-09-13 | Disposition: A | Discharge status: Home / Self Care | Payer: Medicaid Other | Source: Ambulatory Visit | Attending: Women's Health | Admitting: Women's Health

## 2022-09-13 ENCOUNTER — Encounter: Payer: Self-pay | Admitting: Women's Health

## 2022-09-13 ENCOUNTER — Ambulatory Visit (INDEPENDENT_AMBULATORY_CARE_PROVIDER_SITE_OTHER): Payer: Medicaid Other | Admitting: Women's Health

## 2022-09-13 VITALS — BP 136/88 | HR 84 | Ht 65.0 in

## 2022-09-13 DIAGNOSIS — Z3202 Encounter for pregnancy test, result negative: Secondary | ICD-10-CM

## 2022-09-13 DIAGNOSIS — N72 Inflammatory disease of cervix uteri: Secondary | ICD-10-CM | POA: Diagnosis not present

## 2022-09-13 DIAGNOSIS — Z113 Encounter for screening for infections with a predominantly sexual mode of transmission: Secondary | ICD-10-CM

## 2022-09-13 DIAGNOSIS — R87618 Other abnormal cytological findings on specimens from cervix uteri: Secondary | ICD-10-CM | POA: Insufficient documentation

## 2022-09-13 DIAGNOSIS — D067 Carcinoma in situ of other parts of cervix: Secondary | ICD-10-CM

## 2022-09-13 LAB — POCT URINE PREGNANCY: Preg Test, Ur: NEGATIVE

## 2022-09-13 NOTE — Progress Notes (Signed)
   COLPOSCOPY PROCEDURE NOTE Patient name: Stephanie Hodges MRN [id number]  Date of birth: May 28, 1996 Subjective Findings:   Stephanie Hodges is a 26 y.o. G31P1102 African American female being seen today for a colposcopy. Indication: Abnormal pap on 02/18/22: ASC-H w/ HRHPV positive: 16, colpo in April during pregnancy w/ +ACW 6 oclock. Also wants STD screen Prior cytology:  Date Result Procedure  01/11/19 LSIL w/ HRHPV positive: type not specified none  10/24/17 ASCUS w/ HRHPV positive: type not specified None          No LMP recorded. Contraception: IUD. Menopausal: no. Hysterectomy: no.   Smoker: former . Immunocompromised: no.   The risks and benefits were explained and informed consent was obtained, and written copy is in chart. Pertinent History Reviewed:   Reviewed past medical,surgical, social, obstetrical and family history.  Reviewed problem list, medications and allergies. Objective Findings & Procedure:   Vitals:   09/13/22 1406  BP: 136/88  Pulse: 84  Height: '5\' 5"'$  (1.651 m)  Body mass index is 24.63 kg/m.  Results for orders placed or performed in visit on 09/13/22 (from the past 24 hour(s))  POCT urine pregnancy   Collection Time: 09/13/22  2:12 PM  Result Value Ref Range   Preg Test, Ur Negative Negative     Time out was performed.  Speculum placed in the vagina, cervix fully visualized, CV swab obtained for STD screen. SCJ: fully visualized. Cervix swabbed x 3 with acetic acid.  Acetowhitening present: Yes Cervix: no visible lesions, acetowhite lesion(s) noted at 12, 3, 6 o'clock, and punctation noted at 6 o'clock. Cervical biopsies taken at 12, 3, 6 o'clock and Hemostasis achieved with Monsel's solution. Vagina: vaginal colposcopy not performed Vulva: vulvar colposcopy not performed  Specimens: 3  Complications: none  Chaperone: Celene Squibb    Colposcopic Impression & Plan:   Colposcopy findings consistent with HSIL Plan: Post biopsy instructions  given, Will notify patient of results when back, and Will base plan of care on pathology results and ASCCP guidelines  STD screen> per pt request, CV swab sent  Return in about 1 year (around 09/14/2023) for Pap & physical.  Roma Schanz CNM, WHNP-BC 09/13/2022 3:05 PM

## 2022-09-13 NOTE — Patient Instructions (Signed)
Colposcopy, Care After  The following information offers guidance on how to care for yourself after your procedure. Your health care provider may also give you more specific instructions. If you have problems or questions, contact your health care provider. What can I expect after the procedure? If you had a colposcopy without a biopsy, you can expect to feel fine right away after your procedure. However, you may have some spotting of blood for a few days. You can return to your normal activities. If you had a colposcopy with a biopsy, it is common after the procedure to have: Soreness and mild pain. These may last for a few days. Mild vaginal bleeding or discharge that is dark-colored and grainy. This may last for a few days. The discharge may be caused by a liquid (solution) that was used during the procedure. You may need to wear a sanitary pad during this time. Spotting of blood for at least 48 hours after the procedure. Follow these instructions at home: Medicines Take over-the-counter and prescription medicines only as told by your health care provider. Talk with your health care provider about what type of over-the-counter pain medicines and prescription medicines you can start to take again. It is especially important to talk with your health care provider if you take blood thinners. Activity Avoid using douche products, using tampons, and having sex for at least 3 days after the procedure or for as long as told by your health care provider. Return to your normal activities as told by your health care provider. Ask your health care provider what activities are safe for you. General instructions Ask your health care provider if you may take baths, swim, or use a hot tub. You may take showers. If you use birth control (contraception), continue to use it. Keep all follow-up visits. This is important. Contact a health care provider if: You have a fever or chills. You faint or feel  light-headed. Get help right away if: You have heavy bleeding from your vagina or pass blood clots. Heavy bleeding is bleeding that soaks through a sanitary pad in less than 1 hour. You have vaginal discharge that is abnormal, is yellow in color, or smells bad. This could be a sign of infection. You have severe pain or cramps in your lower abdomen that do not go away with medicine. Summary If you had a colposcopy without a biopsy, you can expect to feel fine right away, but you may have some spotting of blood for a few days. You can return to your normal activities. If you had a colposcopy with a biopsy, it is common to have mild pain for a few days and spotting for 48 hours after the procedure. Avoid using douche products, using tampons, and having sex for at least 3 days after the procedure or for as long as told by your health care provider. Get help right away if you have heavy bleeding, severe pain, or signs of infection. This information is not intended to replace advice given to you by your health care provider. Make sure you discuss any questions you have with your health care provider. Document Revised: 04/12/2021 Document Reviewed: 04/12/2021 Elsevier Patient Education  2023 Elsevier Inc.  

## 2022-09-15 LAB — CERVICOVAGINAL ANCILLARY ONLY
Bacterial Vaginitis (gardnerella): POSITIVE — AB
Candida Glabrata: NEGATIVE
Candida Vaginitis: NEGATIVE
Chlamydia: NEGATIVE
Comment: NEGATIVE
Comment: NEGATIVE
Comment: NEGATIVE
Comment: NEGATIVE
Comment: NEGATIVE
Comment: NORMAL
Neisseria Gonorrhea: NEGATIVE
Trichomonas: NEGATIVE

## 2022-09-15 LAB — SURGICAL PATHOLOGY

## 2022-09-20 MED ORDER — METRONIDAZOLE 500 MG PO TABS: 500.0000 mg | ORAL_TABLET | Freq: Two times a day (BID) | ORAL | 0 refills | Status: AC

## 2022-09-20 NOTE — Addendum Note (Signed)
Addended by: Roma Schanz on: 09/20/2022 09:27 AM   Modules accepted: Orders

## 2022-09-22 ENCOUNTER — Encounter: Payer: Self-pay | Admitting: Obstetrics & Gynecology

## 2022-09-22 ENCOUNTER — Ambulatory Visit (INDEPENDENT_AMBULATORY_CARE_PROVIDER_SITE_OTHER): Payer: Medicaid Other | Admitting: Obstetrics & Gynecology

## 2022-09-22 VITALS — BP 127/90 | HR 81 | Ht 65.0 in | Wt 148.0 lb

## 2022-09-22 DIAGNOSIS — R87810 Cervical high risk human papillomavirus (HPV) DNA test positive: Secondary | ICD-10-CM

## 2022-09-22 DIAGNOSIS — Z3009 Encounter for other general counseling and advice on contraception: Secondary | ICD-10-CM | POA: Diagnosis not present

## 2022-09-22 DIAGNOSIS — T8332XA Displacement of intrauterine contraceptive device, initial encounter: Secondary | ICD-10-CM

## 2022-09-22 DIAGNOSIS — D069 Carcinoma in situ of cervix, unspecified: Secondary | ICD-10-CM | POA: Diagnosis not present

## 2022-09-22 NOTE — Progress Notes (Signed)
GYN VISIT Patient name: Stephanie Hodges MRN [id number]  Date of birth: 09-20-1996 Chief Complaint:   Follow-up (Discuss test results)  History of Present Illness:   Stephanie Hodges is a 26 y.o. [phone] female being seen today for the following concerns:.     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  Denies pelvic or abdominal pain currently.  No acute complaints or changes since her prior visit.  +BV- has not yet picked up treatment  Contraceptive management:  per pt she has an IUD but she notes irregular cramping.  Additionally, they were not able to see strings on exam and is concerned that this may not be the best form of contraception for her.  Desires to reconsider her options.  No LMP recorded. (Menstrual status: IUD).     04/14/2022    9:10 AM 12/23/2021    2:50 PM 01/11/2019   10:43 AM 12/07/2018    9:27 AM 10/24/2017    3:14 PM  Depression screen PHQ 2/9  Decreased Interest 0 0 0  0  Down, Depressed, Hopeless 0 0 0 1 3  PHQ - 2 Score 0 0 0 1 3  Altered sleeping 0 0   1  Tired, decreased energy 0 1   1  Change in appetite 0 0   2  Feeling bad or failure about yourself  0 0   1  Trouble concentrating 0 0   2  Moving slowly or fidgety/restless 0 0   1  Suicidal thoughts 0 0   0  PHQ-9 Score 0 1   11  Difficult doing work/chores     Very difficult     Review of Systems:   Pertinent items are noted in HPI Denies fever/chills, dizziness, headaches, visual disturbances, fatigue, shortness of breath, chest pain, abdominal pain, vomiting, no problems with periods, bowel movements, urination, or intercourse unless otherwise stated above.  Pertinent History Reviewed:  Reviewed past medical,surgical, social, obstetrical and family history.  Reviewed problem list, medications and allergies. Physical Assessment:   Vitals:   09/22/22 1540  BP: (!) 127/90  Pulse: 81   Weight: 148 lb (67.1 kg)  Height: '5\' 5"'$  (1.651 m)  Body mass index is 24.63 kg/m.       Physical Examination:   General appearance: alert, well appearing, and in no distress  Psych: mood appropriate, normal affect  Skin: warm & dry   Cardiovascular: normal heart rate noted  Respiratory: normal respiratory effort, no distress  Abdomen: soft, non-tender, no rebound, no guarding  Pelvic: examination not indicated  Extremities: no edema   Chaperone: N/A    Assessment & Plan:  1) CIN 3, HPV 16+ -reviewed recent results and discussed ASCCP guidelines -Reviewed LEEP-reviewed risk/benefit including risk of cervical shortening and potential for preterm labor. Discussed alternative including conservative monitoring with repeat colposcopy in 3 mos- reviewed risk/benefit including but not limited to risk of CIN 3 progression.  Questions/concerns were addressed- desires to proceed with LEEP -reviewed postop care/recovery  2) Contraceptive management, lost IUD -since pt going to OR for LEEP, also desires removal of lost IUD -discussed plan for hysteroscopy, IUD removal -discussed contraceptive options- desires Nexplanon while in OR -referral created  Preop labs to be ordered  Return for TBD.   Janyth Pupa, DO Attending [place], [facility] for [employer], [place]

## 2022-09-24 ENCOUNTER — Encounter: Payer: Self-pay | Admitting: Obstetrics & Gynecology

## 2022-10-20 ENCOUNTER — Encounter (HOSPITAL_COMMUNITY): Payer: Self-pay | Admitting: Emergency Medicine

## 2022-10-20 ENCOUNTER — Emergency Department (HOSPITAL_COMMUNITY)
Admission: EM | Admit: 2022-10-20 | Discharge: 2022-10-20 | Disposition: A | Discharge status: Home / Self Care | Payer: Medicaid Other | Attending: Emergency Medicine | Admitting: Emergency Medicine

## 2022-10-20 ENCOUNTER — Other Ambulatory Visit: Payer: Self-pay

## 2022-10-20 DIAGNOSIS — J3489 Other specified disorders of nose and nasal sinuses: Secondary | ICD-10-CM | POA: Diagnosis not present

## 2022-10-20 DIAGNOSIS — B349 Viral infection, unspecified: Secondary | ICD-10-CM | POA: Diagnosis not present

## 2022-10-20 DIAGNOSIS — R1084 Generalized abdominal pain: Secondary | ICD-10-CM | POA: Diagnosis not present

## 2022-10-20 DIAGNOSIS — R509 Fever, unspecified: Secondary | ICD-10-CM | POA: Diagnosis present

## 2022-10-20 DIAGNOSIS — Z1152 Encounter for screening for COVID-19: Secondary | ICD-10-CM | POA: Diagnosis not present

## 2022-10-20 LAB — RESP PANEL BY RT-PCR (FLU A&B, COVID) ARPGX2
Influenza A by PCR: NEGATIVE
Influenza B by PCR: NEGATIVE
SARS Coronavirus 2 by RT PCR: NEGATIVE

## 2022-10-20 MED ORDER — IBUPROFEN 400 MG PO TABS
400.0000 mg | ORAL_TABLET | Freq: Once | ORAL | Status: AC
  Administered 2022-10-20: 400 mg via ORAL
  Filled 2022-10-20: qty 1

## 2022-10-20 MED ORDER — ACETAMINOPHEN 500 MG PO TABS
1000.0000 mg | ORAL_TABLET | Freq: Once | ORAL | Status: AC
  Administered 2022-10-20: 1000 mg via ORAL
  Filled 2022-10-20: qty 2

## 2022-10-20 NOTE — ED Triage Notes (Signed)
Pt c/o body aches, fever and vomited once since yesterday.

## 2022-10-20 NOTE — Discharge Instructions (Signed)
Thank you for letting us take care of you today.  Your COVID and flu swabs were negative. Continue to manage your fevers and other symptoms with over the counter treatment such as Tylenol, ibuprofen, and cold medicines as needed. Be sure any medicine you choose does not also contain Tylenol or ibuprofen because if so you should not take this separately as well.

## 2022-10-20 NOTE — ED Provider Notes (Signed)
[facility] EMERGENCY DEPARTMENT Provider Note   CSN: [id number] Arrival date & time: 10/20/22  1834     History  Chief Complaint  Patient presents with   Fever    Stephanie Hodges is a 26 y.o. female with no past medical history who presents to the ED with generalized bodyaches, mild bilateral temporal headaches that are sharp in nature, subjective fever, chills, and generalized abdominal pain that started last night as well as nausea and vomiting with p.o. intake that started this morning.  She suspects that she has the flu or COVID though she denies being around any sick contacts.  She denies dyspnea, chest pain, diarrhea, cough, neck pain, sinus pain, vision change, ear pain, sore throat, difficulty swallowing, or previous evaluation for these symptoms.     Home Medications None  Allergies    Coconut flavor    Review of Systems   Review of Systems  Constitutional:  Positive for chills, fatigue and fever. Negative for activity change.  HENT:  Positive for congestion and rhinorrhea. Negative for ear pain, postnasal drip, sinus pressure, sinus pain, sore throat and trouble swallowing.   Eyes:  Negative for photophobia, pain, itching and visual disturbance.  Respiratory:  Negative for cough, chest tightness and shortness of breath.   Cardiovascular:  Negative for chest pain and palpitations.  Gastrointestinal:  Positive for nausea and vomiting. Negative for abdominal pain and diarrhea.  Genitourinary:  Negative for dysuria and hematuria.  Musculoskeletal:  Positive for myalgias. Negative for arthralgias and back pain.  Skin:  Negative for color change and rash.  Neurological:  Positive for headaches. Negative for seizures and syncope.  All other systems reviewed and are negative.   Physical Exam Updated Vital Signs BP (!) 140/79 (BP Location: Right Arm)   Pulse 89   Temp 99.6 F (37.6 C) (Oral)   Resp 18   Ht '5\' 5"'$  (1.651 m)   Wt 70.8 kg   SpO2 97%   BMI 25.96 kg/m   Physical Exam Vitals and nursing note reviewed.  Constitutional:      General: She is not in acute distress.    Appearance: Normal appearance. She is well-developed. She is not toxic-appearing.     Comments: Appears generally fatigued but no focal weakness, does not appear toxic, and is awake, alert, and answering questions appropriately  HENT:     Head: Normocephalic and atraumatic.     Nose: Rhinorrhea (mild bilateral clear) present.     Mouth/Throat:     Mouth: Mucous membranes are moist.     Pharynx: Oropharynx is clear. No oropharyngeal exudate or posterior oropharyngeal erythema.  Eyes:     General:        Right eye: No discharge.        Left eye: No discharge.     Conjunctiva/sclera: Conjunctivae normal.  Cardiovascular:     Rate and Rhythm: Normal rate and regular rhythm.     Heart sounds: Normal heart sounds. No murmur heard. Pulmonary:     Effort: Pulmonary effort is normal. No respiratory distress.     Breath sounds: Normal breath sounds. No wheezing, rhonchi or rales.  Abdominal:     General: Abdomen is flat. There is no distension.     Palpations: Abdomen is soft.     Tenderness: There is no abdominal tenderness. There is no guarding or rebound.  Musculoskeletal:        General: Normal range of motion.     Cervical back: Neck supple.  Skin:  General: Skin is warm and dry.     Capillary Refill: Capillary refill takes less than 2 seconds.     Coloration: Skin is not pale.  Neurological:     General: No focal deficit present.     Mental Status: She is alert and oriented to person, place, and time.  Psychiatric:        Mood and Affect: Mood normal.        Behavior: Behavior normal.        Thought Content: Thought content normal.     ED Results / Procedures / Treatments   Labs (all labs ordered are listed, but only abnormal results are displayed) Labs Reviewed  RESP PANEL BY RT-PCR (FLU A&B, COVID) [id number]     EKG None  Radiology None  Procedures None  Medications Ordered in ED Medications  acetaminophen (TYLENOL) tablet 1,000 mg (1,000 mg Oral Given 10/20/22 1904)  ibuprofen (ADVIL) tablet 400 mg (400 mg Oral Given 10/20/22 1904)    ED Course/ Medical Decision Making/ A&P                           Medical Decision Making This is a healthy 26 year old female with no past medical history who presents with upper respiratory symptoms that started last night.  She complains of rhinorrhea, headaches, generalized bodyaches, subjective fever, chills, and mild generalized abdominal pain.  She is concerned for either the flu or COVID but denies having any positive sick contacts.  She has an unremarkable exam except for some mild bilateral rhinorrhea, but her lungs are clear to auscultation, she has no sinus tenderness, heart sounds regular, and her neurological exam is intact.  She has had some nausea and vomiting today with p.o. intake but does not appear dehydrated and has a normal capillary refill, moist mucous membranes.  Vital signs are unremarkable except for a fever of 103 that was treated with Tylenol and ibuprofen with improvement prior to discharge.  Viral swabs were negative for COVID and influenza.  Very low suspicion for pneumonia with normal lung sounds.  With headache, I have a low suspicion for sinusitis due to location of headaches, no sinus pressure, no sinus tenderness, and with the stains that she would not qualify for sinusitis treatment at this time as her symptoms have only been going on for the last day.  Patient given strict return precautions for worsening of respiratory status or if symptoms do not resolve within the next few days aware that she should follow-up with her PCP.   Amount and/or Complexity of Data Reviewed Labs: ordered. Decision-making details documented in ED Course.            Final Clinical Impression(s) / ED Diagnoses Final diagnoses:  Viral  illness    Rx / DC Orders ED Discharge Orders     None         Turner Daniels 10/20/22 2256    Noemi Chapel, MD 10/21/22 [phone]

## 2022-11-02 NOTE — Patient Instructions (Signed)
Stephanie Hodges  11/02/2022     '@PREFPERIOPPHARMACY'$ @   Your procedure is scheduled on  11/09/2022.   Report to [facility] at  [place]   Call this number if you have problems the morning of surgery:  [phone]  If you experience any cold or flu symptoms such as cough, fever, chills, shortness of breath, etc. between now and your scheduled surgery, please notify us at the above number.   Remember:  Do not eat or drink after midnight.      Take these medicines the morning of surgery with A SIP OF WATER                                              None.     Do not wear jewelry, make-up or nail polish.  Do not wear lotions, powders, or perfumes, or deodorant.  Do not shave 48 hours prior to surgery.  Men may shave face and neck.  Do not bring valuables to the hospital.  [facility] is not responsible for any belongings or valuables.  Contacts, dentures or bridgework may not be worn into surgery.  Leave your suitcase in the car.  After surgery it may be brought to your room.  For patients admitted to the hospital, discharge time will be determined by your treatment team.  Patients discharged the day of surgery will not be allowed to drive home and must  have someone with them for 24 hours.    Special instructions:  DO NOT smoke tobacco or vape for 24 hours before your procedure.  Please read over the following fact sheets that you were given. Coughing and Deep Breathing, Surgical Site Infection Prevention, Anesthesia Post-op Instructions, and Care and Recovery After Surgery      LEEP POST-PROCEDURE INSTRUCTIONS  You may take Ibuprofen, Aleve or Tylenol for pain if needed.  Cramping is normal.  You will have black and/or bloody discharge at first.  This will lighten and then turn clear before completely resolving.  This will take 2 to 3 weeks.  Put nothing in your vagina until the bleeding or discharge stops (usually 2 or3 days).  You need to call if you  have redness around the biopsy site, if there is any unusual draining, if the bleeding is heavy, or if you are concerned.  Shower or bathe as normal  We will call you within one week with results or we will discuss the results at your follow-up appointment if needed.  You will need to return for a follow-up Pap smear as directed by your physician.General Anesthesia, Adult, Care After The following information offers guidance on how to care for yourself after your procedure. Your health care provider may also give you more specific instructions. If you have problems or questions, contact your health care provider. What can I expect after the procedure? After the procedure, it is common for people to: Have pain or discomfort at the IV site. Have nausea or vomiting. Have a sore throat or hoarseness. Have trouble concentrating. Feel cold or chills. Feel weak, sleepy, or tired (fatigue). Have soreness and body aches. These can affect parts of the body that were not involved in surgery. Follow these instructions at home: For the time period you were told by your health care provider:  Rest. Do not participate in activities where  you could fall or become injured. Do not drive or use machinery. Do not drink alcohol. Do not take sleeping pills or medicines that cause drowsiness. Do not make important decisions or sign legal documents. Do not take care of children on your own. General instructions Drink enough fluid to keep your urine pale yellow. If you have sleep apnea, surgery and certain medicines can increase your risk for breathing problems. Follow instructions from your health care provider about wearing your sleep device: Anytime you are sleeping, including during daytime naps. While taking prescription pain medicines, sleeping medicines, or medicines that make you drowsy. Return to your normal activities as told by your health care provider. Ask your health care provider what  activities are safe for you. Take over-the-counter and prescription medicines only as told by your health care provider. Do not use any products that contain nicotine or tobacco. These products include cigarettes, chewing tobacco, and vaping devices, such as e-cigarettes. These can delay incision healing after surgery. If you need help quitting, ask your health care provider. Contact a health care provider if: You have nausea or vomiting that does not get better with medicine. You vomit every time you eat or drink. You have pain that does not get better with medicine. You cannot urinate or have bloody urine. You develop a skin rash. You have a fever. Get help right away if: You have trouble breathing. You have chest pain. You vomit blood. These symptoms may be an emergency. Get help right away. Call 911. Do not wait to see if the symptoms will go away. Do not drive yourself to the hospital. Summary After the procedure, it is common to have a sore throat, hoarseness, nausea, vomiting, or to feel weak, sleepy, or fatigue. For the time period you were told by your health care provider, do not drive or use machinery. Get help right away if you have difficulty breathing, have chest pain, or vomit blood. These symptoms may be an emergency. This information is not intended to replace advice given to you by your health care provider. Make sure you discuss any questions you have with your health care provider. Document Revised: 02/12/2022 Document Reviewed: 02/12/2022 Elsevier Patient Education  [employer].

## 2022-11-04 ENCOUNTER — Encounter (HOSPITAL_COMMUNITY): Payer: Self-pay

## 2022-11-04 ENCOUNTER — Encounter (HOSPITAL_COMMUNITY)
Admission: RE | Admit: 2022-11-04 | Discharge: 2022-11-04 | Disposition: A | Discharge status: Home / Self Care | Payer: Medicaid Other | Source: Ambulatory Visit | Attending: Obstetrics & Gynecology | Admitting: Obstetrics & Gynecology

## 2022-11-04 VITALS — BP 142/77 | HR 83 | Temp 98.5°F | Resp 18 | Ht 65.0 in | Wt 156.0 lb

## 2022-11-04 DIAGNOSIS — Z01818 Encounter for other preprocedural examination: Secondary | ICD-10-CM

## 2022-11-04 DIAGNOSIS — T8332XA Displacement of intrauterine contraceptive device, initial encounter: Secondary | ICD-10-CM | POA: Insufficient documentation

## 2022-11-04 DIAGNOSIS — D069 Carcinoma in situ of cervix, unspecified: Secondary | ICD-10-CM | POA: Insufficient documentation

## 2022-11-04 DIAGNOSIS — T8332XD Displacement of intrauterine contraceptive device, subsequent encounter: Secondary | ICD-10-CM

## 2022-11-04 DIAGNOSIS — Z01812 Encounter for preprocedural laboratory examination: Secondary | ICD-10-CM | POA: Insufficient documentation

## 2022-11-04 LAB — CBC
HCT: 35.2 % — ABNORMAL LOW (ref 36.0–46.0)
Hemoglobin: 12.2 g/dL (ref 12.0–15.0)
MCH: 30 pg (ref 26.0–34.0)
MCHC: 34.7 g/dL (ref 30.0–36.0)
MCV: 86.5 fL (ref 80.0–100.0)
Platelets: 337 10*3/uL (ref 150–400)
RBC: 4.07 MIL/uL (ref 3.87–5.11)
RDW: 13.4 % (ref 11.5–15.5)
WBC: 4.6 10*3/uL (ref 4.0–10.5)
nRBC: 0 % (ref 0.0–0.2)

## 2022-11-04 LAB — BASIC METABOLIC PANEL
Anion gap: 5 (ref 5–15)
BUN: 13 mg/dL (ref 6–20)
CO2: 23 mmol/L (ref 22–32)
Calcium: 8.8 mg/dL — ABNORMAL LOW (ref 8.9–10.3)
Chloride: 107 mmol/L (ref 98–111)
Creatinine, Ser: 0.89 mg/dL (ref 0.44–1.00)
GFR, Estimated: >60 mL/min (ref >60)
Glucose, Bld: 85 mg/dL (ref 70–99)
Potassium: 3.7 mmol/L (ref 3.5–5.1)
Sodium: 135 mmol/L (ref 135–145)

## 2022-11-04 LAB — POCT PREGNANCY, URINE: Preg Test, Ur: NEGATIVE

## 2022-11-04 NOTE — H&P (Signed)
[facility] Attending History and Physical  Stephanie Hodges is a 26 y.o. 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  Pt desires removal of device and transition to Nexplanon  Denies any abnormal vaginal discharge, fevers, chills, sweats, dysuria, nausea, vomiting, other GI or GU symptoms or other general symptoms.  Past Medical History:  Diagnosis Date   Abnormal Papanicolaou smear of cervix with positive human papilloma virus (HPV) test 01/16/2019   LGSIL with +HPV, will get colpo   ASCUS with positive high risk HPV cervical 10/31/2017   Contraceptive education 10/03/2014   Headache(784.0)    migraines   History of chlamydia    Nexplanon insertion 10/16/2014   Inserted left arm 10/16/14 remove 10/16/17   No pertinent past medical history    Pregnancy induced hypertension    Pregnant    Seasonal allergies    Past Surgical History:  Procedure Laterality Date   BREAST BIOPSY Left 05/23/2019   Procedure: BREAST BIOPSY WITH NEEDLE LOCALIZATION;  Surgeon: Virl Cagey, MD;  Location: [facility];  Service: General;  Laterality: Left;   OB History  Gravida Para Term Preterm AB Living  '2 2 1 1   2  '$ SAB IAB Ectopic Multiple Live Births        0 2    # Outcome Date GA Lbr Len/2nd Weight Sex Delivery Anes PTL Lv  2 Term 06/15/22 86w2d03:11 / 00:33 2948 g M Vag-Spont EPI  LIV  1 Preterm 07/07/12 3103w0d6:05 / 01:41 2495 g F Vag-Spont EPI N LIV     Birth Comments: molding     Complications: Preterm premature rupture of membranes  Patient denies any other pertinent gynecologic issues.  No current facility-administered medications on file prior to encounter.   Current Outpatient Medications on File  Prior to Encounter  Medication Sig Dispense Refill   Blood Pressure Monitor MISC For regular home bp monitoring during pregnancy (Patient not taking: Reported on 09/22/2022) 1 each 0   famotidine (PEPCID) 20 MG tablet Take 1 tablet (20 mg total) by mouth daily. May to increase to twice daily if symptoms not improving. (Patient not taking: Reported on 06/22/2022) 60 tablet 1   ibuprofen (ADVIL) 600 MG tablet Take 1 tablet (600 mg total) by mouth every 6 (six) hours. (Patient not taking: Reported on 09/22/2022) 30 tablet 0   metroNIDAZOLE (FLAGYL) 500 MG tablet Take 1 tablet (500 mg total) by mouth 2 (two) times daily. (Patient not taking: Reported on 09/22/2022) 14 tablet 0   Prenatal Vit-Fe Fumarate-FA (PRENATAL VITAMIN PO) Take by mouth. (Patient not taking: Reported on 09/22/2022)     Allergies  Allergen Reactions   Coconut Flavor     coconut    Social History:   reports that she has quit smoking. Her smoking use included cigarettes. She smoked an average of .22 packs per day. She has never used smokeless tobacco. She reports current alcohol use. She reports that she does not use drugs. Family History  Adopted: Yes  Problem Relation Age of Onset   Hypertension Mother    Stroke Mother     Review of Systems: Pertinent items noted in HPI and remainder of comprehensive ROS otherwise negative.  PHYSICAL EXAM: Last menstrual  period 11/04/2022, not currently breastfeeding. CONSTITUTIONAL: Well-developed, well-nourished female in no acute distress.  SKIN: Skin is warm and dry. No rash noted. Not diaphoretic. No erythema. No pallor. NEUROLOGIC: Alert and oriented to person, place, and time. Normal reflexes, muscle tone coordination. No cranial nerve deficit noted. PSYCHIATRIC: Normal mood and affect. Normal behavior. Normal judgment and thought content. CARDIOVASCULAR: Normal heart rate noted, regular rhythm RESPIRATORY: Effort and breath sounds normal, no problems with respiration  noted ABDOMEN: Soft, nontender, nondistended. PELVIC: deferred MUSCULOSKELETAL: no calf tenderness bilaterally EXT: no edema bilaterally, normal pulses  Labs: Results for orders placed or performed during the hospital encounter of 11/04/22 (from the past 336 hour(s))  CBC   Collection Time: 11/04/22  9:26 AM  Result Value Ref Range   WBC 4.6 4.0 - 10.5 K/uL   RBC 4.07 3.87 - 5.11 MIL/uL   Hemoglobin 12.2 12.0 - 15.0 g/dL   HCT 35.2 (L) 36.0 - 46.0 %   MCV 86.5 80.0 - 100.0 fL   MCH 30.0 26.0 - 34.0 pg   MCHC 34.7 30.0 - 36.0 g/dL   RDW 13.4 11.5 - 15.5 %   Platelets 337 150 - 400 K/uL   nRBC 0.0 0.0 - 0.2 %  Basic metabolic panel   Collection Time: 11/04/22  9:26 AM  Result Value Ref Range   Sodium 135 135 - 145 mmol/L   Potassium 3.7 3.5 - 5.1 mmol/L   Chloride 107 98 - 111 mmol/L   CO2 23 22 - 32 mmol/L   Glucose, Bld 85 70 - 99 mg/dL   BUN 13 6 - 20 mg/dL   Creatinine, Ser 0.89 0.44 - 1.00 mg/dL   Calcium 8.8 (L) 8.9 - 10.3 mg/dL   GFR, Estimated >60 >60 mL/min   Anion gap 5 5 - 15  Pregnancy, urine POC   Collection Time: 11/04/22  9:35 AM  Result Value Ref Range   Preg Test, Ur NEGATIVE NEGATIVE    Assessment: Lost IUD strings 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  Questions and concerns were addressed and pt desires to proceed  Janyth Pupa, DO Attending [place], [facility] for [facility], [place]

## 2022-11-09 ENCOUNTER — Ambulatory Visit (HOSPITAL_BASED_OUTPATIENT_CLINIC_OR_DEPARTMENT_OTHER): Payer: Medicaid Other | Admitting: Certified Registered Nurse Anesthetist

## 2022-11-09 ENCOUNTER — Other Ambulatory Visit: Payer: Self-pay

## 2022-11-09 ENCOUNTER — Encounter (HOSPITAL_COMMUNITY)
Admission: RE | Disposition: A | Discharge status: Home / Self Care | Payer: Self-pay | Source: Home / Self Care | Attending: Obstetrics & Gynecology

## 2022-11-09 ENCOUNTER — Encounter (HOSPITAL_COMMUNITY): Payer: Self-pay | Admitting: Obstetrics & Gynecology

## 2022-11-09 ENCOUNTER — Ambulatory Visit (HOSPITAL_COMMUNITY): Payer: Medicaid Other | Admitting: Certified Registered Nurse Anesthetist

## 2022-11-09 ENCOUNTER — Ambulatory Visit (HOSPITAL_COMMUNITY)
Admission: RE | Admit: 2022-11-09 | Discharge: 2022-11-09 | Disposition: A | Discharge status: Home / Self Care | Payer: Medicaid Other | Attending: Obstetrics & Gynecology | Admitting: Obstetrics & Gynecology

## 2022-11-09 DIAGNOSIS — Z30432 Encounter for removal of intrauterine contraceptive device: Secondary | ICD-10-CM | POA: Diagnosis not present

## 2022-11-09 DIAGNOSIS — I10 Essential (primary) hypertension: Secondary | ICD-10-CM | POA: Diagnosis not present

## 2022-11-09 DIAGNOSIS — Z87891 Personal history of nicotine dependence: Secondary | ICD-10-CM | POA: Insufficient documentation

## 2022-11-09 DIAGNOSIS — Z30017 Encounter for initial prescription of implantable subdermal contraceptive: Secondary | ICD-10-CM | POA: Diagnosis not present

## 2022-11-09 DIAGNOSIS — D127 Benign neoplasm of rectosigmoid junction: Secondary | ICD-10-CM

## 2022-11-09 DIAGNOSIS — T8332XA Displacement of intrauterine contraceptive device, initial encounter: Secondary | ICD-10-CM | POA: Diagnosis not present

## 2022-11-09 DIAGNOSIS — D069 Carcinoma in situ of cervix, unspecified: Secondary | ICD-10-CM

## 2022-11-09 DIAGNOSIS — T8332XD Displacement of intrauterine contraceptive device, subsequent encounter: Secondary | ICD-10-CM | POA: Diagnosis not present

## 2022-11-09 DIAGNOSIS — Z01818 Encounter for other preprocedural examination: Secondary | ICD-10-CM

## 2022-11-09 HISTORY — PX: IUD REMOVAL: SHX5392

## 2022-11-09 HISTORY — PX: HYSTEROSCOPY: SHX211

## 2022-11-09 HISTORY — PX: LEEP: SHX91

## 2022-11-09 HISTORY — PX: INSERTION OF NON VAGINAL CONTRACEPTIVE DEVICE: SHX6253

## 2022-11-09 SURGERY — LEEP (LOOP ELECTROSURGICAL EXCISION PROCEDURE)
Anesthesia: General | Site: Uterus

## 2022-11-09 MED ORDER — FENTANYL CITRATE (PF) 100 MCG/2ML IJ SOLN
INTRAMUSCULAR | Status: DC | PRN
  Administered 2022-11-09 (×2): 50 ug via INTRAVENOUS

## 2022-11-09 MED ORDER — CHLORHEXIDINE GLUCONATE 0.12 % MT SOLN: 15.0000 mL | Freq: Once | OROMUCOSAL | Status: AC

## 2022-11-09 MED ORDER — PROPOFOL 10 MG/ML IV BOLUS
INTRAVENOUS | Status: DC | PRN
  Administered 2022-11-09: 200 mg via INTRAVENOUS

## 2022-11-09 MED ORDER — PHENYLEPHRINE 80 MCG/ML (10ML) SYRINGE FOR IV PUSH (FOR BLOOD PRESSURE SUPPORT)
PREFILLED_SYRINGE | INTRAVENOUS | Status: DC | PRN
  Administered 2022-11-09: 160 ug via INTRAVENOUS

## 2022-11-09 MED ORDER — PHENYLEPHRINE 80 MCG/ML (10ML) SYRINGE FOR IV PUSH (FOR BLOOD PRESSURE SUPPORT)
PREFILLED_SYRINGE | INTRAVENOUS | Status: AC
  Filled 2022-11-09: qty 10

## 2022-11-09 MED ORDER — FENTANYL CITRATE PF 50 MCG/ML IJ SOSY: 25.0000 ug | PREFILLED_SYRINGE | INTRAMUSCULAR | Status: DC | PRN

## 2022-11-09 MED ORDER — ALBUMIN HUMAN 5 % IV SOLN
INTRAVENOUS | Status: AC
  Filled 2022-11-09: qty 250

## 2022-11-09 MED ORDER — LIDOCAINE HCL 1 % IJ SOLN: 0.0000 mL | Freq: Once | INTRAMUSCULAR | Status: DC | PRN

## 2022-11-09 MED ORDER — ETONOGESTREL 68 MG ~~LOC~~ IMPL
68.0000 mg | DRUG_IMPLANT | Freq: Once | SUBCUTANEOUS | Status: DC
  Filled 2022-11-09: qty 1

## 2022-11-09 MED ORDER — IBUPROFEN 600 MG PO TABS: 600.0000 mg | ORAL_TABLET | Freq: Four times a day (QID) | ORAL | 0 refills | Status: DC | PRN

## 2022-11-09 MED ORDER — ACETIC ACID 4% SOLUTION
Status: DC | PRN
  Administered 2022-11-09: 1 via TOPICAL

## 2022-11-09 MED ORDER — FERRIC SUBSULFATE 259 MG/GM EX SOLN
CUTANEOUS | Status: AC
  Filled 2022-11-09: qty 8

## 2022-11-09 MED ORDER — MIDAZOLAM HCL 2 MG/2ML IJ SOLN
INTRAMUSCULAR | Status: AC
  Filled 2022-11-09: qty 2

## 2022-11-09 MED ORDER — SODIUM CHLORIDE 0.9 % IR SOLN
Status: DC | PRN
  Administered 2022-11-09: 3000 mL

## 2022-11-09 MED ORDER — FENTANYL CITRATE (PF) 100 MCG/2ML IJ SOLN
INTRAMUSCULAR | Status: AC
  Filled 2022-11-09: qty 2

## 2022-11-09 MED ORDER — ONDANSETRON HCL 4 MG/2ML IJ SOLN: 4.0000 mg | Freq: Once | INTRAMUSCULAR | Status: DC | PRN

## 2022-11-09 MED ORDER — ORAL CARE MOUTH RINSE: 15.0000 mL | Freq: Once | OROMUCOSAL | Status: AC

## 2022-11-09 MED ORDER — ONDANSETRON HCL 4 MG/2ML IJ SOLN
INTRAMUSCULAR | Status: DC | PRN
  Administered 2022-11-09: 4 mg via INTRAVENOUS

## 2022-11-09 MED ORDER — LIDOCAINE-EPINEPHRINE 0.5 %-1:200000 IJ SOLN
INTRAMUSCULAR | Status: AC
  Filled 2022-11-09: qty 1

## 2022-11-09 MED ORDER — PROPOFOL 500 MG/50ML IV EMUL
INTRAVENOUS | Status: AC
  Filled 2022-11-09: qty 50

## 2022-11-09 MED ORDER — FERRIC SUBSULFATE (BULK) SOLN
Status: DC | PRN
  Administered 2022-11-09: 1

## 2022-11-09 MED ORDER — MIDAZOLAM HCL 5 MG/5ML IJ SOLN
INTRAMUSCULAR | Status: DC | PRN
  Administered 2022-11-09: 2 mg via INTRAVENOUS

## 2022-11-09 MED ORDER — LIDOCAINE 2% (20 MG/ML) 5 ML SYRINGE
INTRAMUSCULAR | Status: DC | PRN
  Administered 2022-11-09: 60 mg via INTRAVENOUS

## 2022-11-09 MED ORDER — PROPOFOL 500 MG/50ML IV EMUL
INTRAVENOUS | Status: AC
  Filled 2022-11-09: qty 200

## 2022-11-09 MED ORDER — KETOROLAC TROMETHAMINE 15 MG/ML IJ SOLN
30.0000 mg | INTRAMUSCULAR | Status: AC
  Administered 2022-11-09: 30 mg via INTRAVENOUS
  Filled 2022-11-09: qty 2

## 2022-11-09 MED ORDER — 0.9 % SODIUM CHLORIDE (POUR BTL) OPTIME
TOPICAL | Status: DC | PRN
  Administered 2022-11-09: 1000 mL

## 2022-11-09 MED ORDER — LACTATED RINGERS IV SOLN: INTRAVENOUS | Status: DC

## 2022-11-09 MED ORDER — CHLORHEXIDINE GLUCONATE 0.12 % MT SOLN
OROMUCOSAL | Status: AC
  Administered 2022-11-09: 15 mL
  Filled 2022-11-09: qty 15

## 2022-11-09 MED ORDER — HYDROCODONE-ACETAMINOPHEN 7.5-325 MG PO TABS: 1.0000 | ORAL_TABLET | Freq: Once | ORAL | Status: DC | PRN

## 2022-11-09 MED ORDER — DEXAMETHASONE SODIUM PHOSPHATE 10 MG/ML IJ SOLN
INTRAMUSCULAR | Status: DC | PRN
  Administered 2022-11-09: 10 mg via INTRAVENOUS

## 2022-11-09 MED ORDER — LIDOCAINE-EPINEPHRINE 0.5 %-1:200000 IJ SOLN
INTRAMUSCULAR | Status: DC | PRN
  Administered 2022-11-09: 20 mL

## 2022-11-09 SURGICAL SUPPLY — 48 items
APPLICATOR COTTON TIP 6 STRL (MISCELLANEOUS) ×3 IMPLANT
APPLICATOR COTTON TIP 6IN STRL (MISCELLANEOUS) ×3
CATH ROBINSON RED A/P 16FR (CATHETERS) IMPLANT
CLOTH BEACON ORANGE TIMEOUT ST (SAFETY) ×3 IMPLANT
COVER LIGHT HANDLE STERIS (MISCELLANEOUS) ×9 IMPLANT
DEVICE MYOSURE LITE (MISCELLANEOUS) IMPLANT
DEVICE MYOSURE REACH (MISCELLANEOUS) IMPLANT
DILATOR CANAL MILEX (MISCELLANEOUS) IMPLANT
ELECT BALL LEEP 5MM RED (ELECTRODE) ×3 IMPLANT
ELECT LOOP LEEP RND 10X10 YLW (CUTTING LOOP)
ELECT LOOP LEEP RND 15X12 GRN (CUTTING LOOP)
ELECT LOOP LEEP RND 20X12 WHT (CUTTING LOOP) ×3
ELECT REM PT RETURN 9FT ADLT (ELECTROSURGICAL) ×3
ELECTRODE LOOP LP RND 10X10YLW (CUTTING LOOP) IMPLANT
ELECTRODE LOOP LP RND 15X12GRN (CUTTING LOOP) IMPLANT
ELECTRODE LOOP LP RND 20X12WHT (CUTTING LOOP) IMPLANT
ELECTRODE REM PT RTRN 9FT ADLT (ELECTROSURGICAL) ×3 IMPLANT
EXTENDER ELECT LOOP LEEP 10CM (CUTTING LOOP) IMPLANT
GAUZE 4X4 16PLY ~~LOC~~+RFID DBL (SPONGE) ×6 IMPLANT
GLOVE BIO SURGEON STRL SZ 6.5 (GLOVE) ×3 IMPLANT
GLOVE BIO SURGEON STRL SZ7 (GLOVE) ×3 IMPLANT
GLOVE BIOGEL PI IND STRL 7.0 (GLOVE) ×9 IMPLANT
GOWN STRL REUS W/ TWL LRG LVL3 (GOWN DISPOSABLE) ×3 IMPLANT
GOWN STRL REUS W/TWL LRG LVL3 (GOWN DISPOSABLE) ×6 IMPLANT
IV NS IRRIG 3000ML ARTHROMATIC (IV SOLUTION) ×3 IMPLANT
KIT PROCEDURE FLUENT (KITS) ×3 IMPLANT
KIT TURNOVER CYSTO (KITS) ×3 IMPLANT
KIT TURNOVER KIT A (KITS) ×3 IMPLANT
MYOSURE XL FIBROID (MISCELLANEOUS)
NDL HYPO 18GX1.5 BLUNT FILL (NEEDLE) ×3 IMPLANT
NEEDLE HYPO 18GX1.5 BLUNT FILL (NEEDLE) IMPLANT
NS IRRIG 1000ML POUR BTL (IV SOLUTION) ×3 IMPLANT
NS IRRIG 500ML POUR BTL (IV SOLUTION) ×3 IMPLANT
PACK PERI GYN (CUSTOM PROCEDURE TRAY) ×3 IMPLANT
PAD ARMBOARD 7.5X6 YLW CONV (MISCELLANEOUS) ×3 IMPLANT
PAD TELFA 3X4 1S STER (GAUZE/BANDAGES/DRESSINGS) ×3 IMPLANT
SCOPETTES 8  STERILE (MISCELLANEOUS) ×3
SCOPETTES 8 STERILE (MISCELLANEOUS) ×3 IMPLANT
SEAL ROD LENS SCOPE MYOSURE (ABLATOR) ×3 IMPLANT
SET BASIN LINEN APH (SET/KITS/TRAYS/PACK) ×3 IMPLANT
SOL PREP POV-IOD 4OZ 10% (MISCELLANEOUS) ×3 IMPLANT
SUT VIC AB 0 CT1 27 (SUTURE) ×3
SUT VIC AB 0 CT1 27XBRD ANBCTR (SUTURE) ×3 IMPLANT
SYR 30ML LL (SYRINGE) ×3 IMPLANT
SYR CONTROL 10ML LL (SYRINGE) ×3 IMPLANT
SYSTEM TISS REMOVAL MYOSURE XL (MISCELLANEOUS) IMPLANT
TOWEL OR 17X26 4PK STRL BLUE (TOWEL DISPOSABLE) ×3 IMPLANT
UNDERPAD 30X36 HEAVY ABSORB (UNDERPADS AND DIAPERS) ×3 IMPLANT

## 2022-11-09 NOTE — Discharge Instructions (Signed)
HOME INSTRUCTIONS  Please note any unusual or excessive bleeding, pain, swelling. Mild dizziness or drowsiness are normal for about 24 hours after surgery.   Shower when comfortable  Restrictions: No driving for 24 hours or while taking pain medications.  Activity:  No heavy lifting (> 10 lbs), nothing in vagina (no tampons, douching, or intercourse) x 4 weeks; no tub baths for 4 weeks Vaginal spotting is expected but if your bleeding is heavy, period like,  please call the office   Diet:  You may return to your regular diet.  Do not eat large meals.  Eat small frequent meals throughout the day.  Continue to drink a good amount of water at least 6-8 glasses of water per day, hydration is very important for the healing process.  Pain Management: Take over the counter tylenol or ibuprofen as needed for pain.  You can either take one or alternate between the two medications for pain management.  You may also use a heating pack as needed.    Alcohol -- Avoid for 24 hours and while taking pain medications.  Nausea: Take sips of ginger ale or soda  Fever -- Call physician if temperature over 101 degrees  Follow up:  If you do not already have a follow up appointment scheduled, please call the office at [phone].  If you experience fever (a temperature greater than 100.4), pain unrelieved by pain medication, shortness of breath, swelling of a single leg, or any other symptoms which are concerning to you please the office immediately.  

## 2022-11-09 NOTE — Op Note (Signed)
Operative Report  PreOp: 1) IUD migration/lost IUD  2) High grade dysplasia 3) Desire for contraception-nexplanon   PostOp: same  Procedure:  Hysteroscopy, IUD removal, Colposcopy, LEEP, Nexplanon insertion Surgeon: Dr. Janyth Pupa Anesthesia: General Complications:none EBL: Minimal IVF:800 Discrepancy: 120cc  Findings: 8cm uterus with proliferative endometrium.  Both ostia visualized.  Specimens: cervical biopsy and inner margin  Procedure: The patient was taken to the operating room where she underwent general anesthesia without difficulty. The patient was placed in a low lithotomy position using Allen stirrups. She was then prepped and draped in the normal sterile fashion. Bi-valve coated sterile speculum was placed.  A single tooth tenaculum was placed on the anterior lip of the cervix. Cervical block was completed using 0.5% lidocaine with epinephrine.  The uterus was then sounded to 8cm. The endocervical canal was then serially dilated to 14French using Hank dilators.  The IUD had migrated, strings were visualized and device removed intact.  Hysteroscope was completed, both ostia visualized.  The hysteroscope was removed and attention was turned to her cervix.  Acetic acid solution was applied to the cervix and areas of uptake were noted around the transformation zone.   The suction was turned on and the Medium 1X Fisher Cone Biopsy Excisor on 25 Watts of blended current was used to excise the entire transformation zone.  Excellent hemostasis was achieved using roller ball coagulation set at 50 Watts coagulation current. Monsel's solution was then applied and the speculum was removed from the vagina. Specimens were sent to pathology.  She is right-handed, so her left arm, approximately 10cm from the medial epicondyle and 3-5cm posterior to the sulcus, was cleansed with betadine and anesthetized with 2cc of 0.5% Lidocaine with epi.  The area was cleansed again with betadine and the  Nexplanon was inserted per manufacturer's recommendations without difficulty.   Counts were correct and pt was taken to recovery room in stable condition.   Janyth Pupa, DO Attending [place], [facility] for [employer], [place]

## 2022-11-09 NOTE — Anesthesia Preprocedure Evaluation (Signed)
Anesthesia Evaluation  Patient identified by MRN, date of birth, ID band Patient awake    Reviewed: Allergy & Precautions, NPO status , Patient's Chart, lab work & pertinent test results  Airway Mallampati: II       Dental  (+) Teeth Intact   Pulmonary neg pulmonary ROS, former smoker   breath sounds clear to auscultation       Cardiovascular Exercise Tolerance: Good hypertension,  Rhythm:regular     Neuro/Psych  Headaches PSYCHIATRIC DISORDERS Anxiety Depression       GI/Hepatic negative GI ROS, Neg liver ROS,,,  Endo/Other  negative endocrine ROS    Renal/GU negative Renal ROS     Musculoskeletal negative musculoskeletal ROS (+)    Abdominal   Peds  Hematology negative hematology ROS (+)   Anesthesia Other Findings Denies any other sig PMH   Reproductive/Obstetrics negative OB ROS                             Anesthesia Physical Anesthesia Plan  ASA: 2  Anesthesia Plan: General   Post-op Pain Management:    Induction: Intravenous  PONV Risk Score and Plan: 1  Airway Management Planned:   Additional Equipment:   Intra-op Plan:   Post-operative Plan:   Informed Consent: I have reviewed the patients History and Physical, chart, labs and discussed the procedure including the risks, benefits and alternatives for the proposed anesthesia with the patient or authorized representative who has indicated his/her understanding and acceptance.       Plan Discussed with: CRNA  Anesthesia Plan Comments:         Anesthesia Quick Evaluation

## 2022-11-09 NOTE — Transfer of Care (Signed)
Immediate Anesthesia Transfer of Care Note  Patient: Stephanie Hodges  Procedure(s) Performed: LOOP ELECTROSURGICAL EXCISION PROCEDURE (LEEP) (Cervix) HYSTEROSCOPY (Uterus) INTRAUTERINE DEVICE (IUD) REMOVAL (Uterus) INSERTION OF NON VAGINAL CONTRACEPTIVE DEVICE - NEXPLANON (Arm Upper)  Patient Location: PACU  Anesthesia Type:General  Level of Consciousness: awake, alert , and oriented  Airway & Oxygen Therapy: Patient Spontanous Breathing and Patient connected to face mask oxygen  Post-op Assessment: Report given to RN and Post -op Vital signs reviewed and stable  Post vital signs: Reviewed and stable  Last Vitals:  Vitals Value Taken Time  BP 123/80   Temp 97.5   Pulse 51 11/09/22 1306  Resp 13 11/09/22 1306  SpO2 97 % 11/09/22 1306  Vitals shown include unvalidated device data.  Last Pain:  Vitals:   11/09/22 1129  TempSrc: Oral  PainSc: 0-No pain         Complications: No notable events documented.

## 2022-11-09 NOTE — Anesthesia Procedure Notes (Signed)
Procedure Name: LMA Insertion Date/Time: 11/09/2022 12:24 PM  Performed by: Eual Fines, RNPre-anesthesia Checklist: Patient identified, Emergency Drugs available, Suction available and Patient being monitored Patient Re-evaluated:Patient Re-evaluated prior to induction Oxygen Delivery Method: Circle system utilized Preoxygenation: Pre-oxygenation with 100% oxygen Induction Type: IV induction Ventilation: Mask ventilation without difficulty LMA: LMA inserted LMA Size: 4.0 Number of attempts: 1 Airway Equipment and Method: Bite block Placement Confirmation: positive ETCO2 Tube secured with: Tape Dental Injury: Teeth and Oropharynx as per pre-operative assessment

## 2022-11-09 NOTE — Anesthesia Postprocedure Evaluation (Signed)
Anesthesia Post Note  Patient: Stephanie Hodges  Procedure(s) Performed: LOOP ELECTROSURGICAL EXCISION PROCEDURE (LEEP) (Cervix) HYSTEROSCOPY (Uterus) INTRAUTERINE DEVICE (IUD) REMOVAL (Uterus) INSERTION OF NON VAGINAL CONTRACEPTIVE DEVICE - NEXPLANON (Arm Upper)  Patient location during evaluation: PACU Anesthesia Type: General Level of consciousness: awake and alert Pain management: pain level controlled Vital Signs Assessment: post-procedure vital signs reviewed and stable Respiratory status: spontaneous breathing, nonlabored ventilation and respiratory function stable Cardiovascular status: blood pressure returned to baseline and stable Postop Assessment: no apparent nausea or vomiting Anesthetic complications: no   There were no known notable events for this encounter.   Last Vitals:  Vitals:   11/09/22 1330 11/09/22 1336  BP: 121/76 121/76  Pulse: (!) 51 (!) 54  Resp: 15 17  Temp:  36.6 C  SpO2: 98% 99%    Last Pain:  Vitals:   11/09/22 1336  TempSrc: Oral  PainSc: 3                  Trixie Rude

## 2022-11-10 ENCOUNTER — Encounter: Payer: Self-pay | Admitting: Obstetrics & Gynecology

## 2022-11-10 ENCOUNTER — Encounter: Payer: Self-pay | Admitting: *Deleted

## 2022-11-10 LAB — SURGICAL PATHOLOGY

## 2022-11-15 ENCOUNTER — Encounter (HOSPITAL_COMMUNITY): Payer: Self-pay | Admitting: Obstetrics & Gynecology

## 2022-12-07 ENCOUNTER — Encounter: Payer: Medicaid Other | Admitting: Obstetrics & Gynecology

## 2022-12-08 ENCOUNTER — Encounter: Payer: Medicaid Other | Admitting: Obstetrics & Gynecology

## 2023-03-30 ENCOUNTER — Other Ambulatory Visit: Payer: Self-pay | Admitting: Obstetrics & Gynecology

## 2023-03-31 MED ORDER — IBUPROFEN 600 MG PO TABS: 600.0000 mg | ORAL_TABLET | Freq: Four times a day (QID) | ORAL | 0 refills | Status: AC | PRN

## 2023-07-06 ENCOUNTER — Ambulatory Visit: Payer: Medicaid Other | Admitting: Advanced Practice Midwife

## 2023-09-16 ENCOUNTER — Other Ambulatory Visit: Payer: Medicaid Other
